# Patient Record
Sex: Female | Born: 1935
Health system: Southern US, Community
[De-identification: ages and names within clinical notes are randomized; demographics above are authoritative.]

## PROBLEM LIST (undated history)

## (undated) DIAGNOSIS — E860 Dehydration: Secondary | ICD-10-CM

## (undated) DIAGNOSIS — M810 Age-related osteoporosis without current pathological fracture: Secondary | ICD-10-CM

## (undated) DIAGNOSIS — I200 Unstable angina: Secondary | ICD-10-CM

## (undated) DIAGNOSIS — I959 Hypotension, unspecified: Secondary | ICD-10-CM

## (undated) DIAGNOSIS — R110 Nausea: Secondary | ICD-10-CM

## (undated) DIAGNOSIS — R630 Anorexia: Secondary | ICD-10-CM

## (undated) DIAGNOSIS — I2510 Atherosclerotic heart disease of native coronary artery without angina pectoris: Secondary | ICD-10-CM

## (undated) DIAGNOSIS — D3131 Benign neoplasm of right choroid: Secondary | ICD-10-CM

## (undated) DIAGNOSIS — M8580 Other specified disorders of bone density and structure, unspecified site: Secondary | ICD-10-CM

## (undated) DIAGNOSIS — J309 Allergic rhinitis, unspecified: Secondary | ICD-10-CM

## (undated) DIAGNOSIS — I10 Essential (primary) hypertension: Secondary | ICD-10-CM

## (undated) HISTORY — DX: Age-related osteoporosis without current pathological fracture: M81.0

## (undated) HISTORY — DX: Hypotension, unspecified: I95.9

## (undated) HISTORY — DX: Dehydration: E86.0

## (undated) HISTORY — DX: Allergic rhinitis, unspecified: J30.9

## (undated) HISTORY — DX: Nausea: R11.0

## (undated) HISTORY — DX: Other specified disorders of bone density and structure, unspecified site: M85.80

## (undated) HISTORY — PX: ABDOMINAL HYSTERECTOMY: SHX81

## (undated) HISTORY — DX: Anorexia: R63.0

## (undated) HISTORY — DX: Benign neoplasm of right choroid: D31.31

## (undated) HISTORY — DX: Atherosclerotic heart disease of native coronary artery without angina pectoris: I25.10

---

## 2007-01-18 ENCOUNTER — Emergency Department (HOSPITAL_COMMUNITY): Admission: EM | Admit: 2007-01-18 | Discharge: 2007-01-18 | Payer: Self-pay | Admitting: Emergency Medicine

## 2009-07-26 ENCOUNTER — Encounter: Admission: RE | Admit: 2009-07-26 | Discharge: 2009-07-26 | Payer: Self-pay | Admitting: Family Medicine

## 2009-10-10 ENCOUNTER — Ambulatory Visit (HOSPITAL_BASED_OUTPATIENT_CLINIC_OR_DEPARTMENT_OTHER): Admission: RE | Admit: 2009-10-10 | Discharge: 2009-10-10 | Payer: Self-pay | Admitting: Family Medicine

## 2009-10-10 ENCOUNTER — Ambulatory Visit: Payer: Self-pay | Admitting: Radiology

## 2010-07-25 ENCOUNTER — Other Ambulatory Visit: Payer: Self-pay | Admitting: Family Medicine

## 2010-07-25 DIAGNOSIS — Z1231 Encounter for screening mammogram for malignant neoplasm of breast: Secondary | ICD-10-CM

## 2010-08-01 ENCOUNTER — Ambulatory Visit
Admission: RE | Admit: 2010-08-01 | Discharge: 2010-08-01 | Disposition: A | Discharge status: Home / Self Care | Payer: Medicare Other | Source: Ambulatory Visit | Attending: Family Medicine | Admitting: Family Medicine

## 2010-08-01 DIAGNOSIS — Z1231 Encounter for screening mammogram for malignant neoplasm of breast: Secondary | ICD-10-CM

## 2011-02-01 LAB — DIFFERENTIAL
Basophils Relative: 0
Eosinophils Absolute: 0.1
Lymphocytes Relative: 16
Lymphs Abs: 2.1
Neutrophils Relative %: 78 — ABNORMAL HIGH

## 2011-02-01 LAB — COMPREHENSIVE METABOLIC PANEL
ALT: 15
Albumin: 3.9
BUN: 8
CO2: 26
Calcium: 9.1
Potassium: 3.9
Total Protein: 7.1

## 2011-02-01 LAB — URINALYSIS, ROUTINE W REFLEX MICROSCOPIC
Ketones, ur: 15 — AB
pH: 7.5

## 2011-02-01 LAB — URINE CULTURE: Colony Count: 6000

## 2011-02-01 LAB — CBC
HCT: 38.8
Platelets: 243
RBC: 4.34
WBC: 13.2 — ABNORMAL HIGH

## 2011-02-01 LAB — POCT CARDIAC MARKERS
CKMB, poc: <1 — ABNORMAL LOW
Myoglobin, poc: 87.8
Operator id: 270111

## 2011-02-01 LAB — APTT: aPTT: 21 — ABNORMAL LOW

## 2011-02-01 LAB — PROTIME-INR
INR: 1
Prothrombin Time: 12.9

## 2011-07-02 ENCOUNTER — Other Ambulatory Visit: Payer: Self-pay | Admitting: Family Medicine

## 2011-07-02 DIAGNOSIS — Z1231 Encounter for screening mammogram for malignant neoplasm of breast: Secondary | ICD-10-CM

## 2011-07-24 DIAGNOSIS — E559 Vitamin D deficiency, unspecified: Secondary | ICD-10-CM | POA: Diagnosis not present

## 2011-07-24 DIAGNOSIS — I10 Essential (primary) hypertension: Secondary | ICD-10-CM | POA: Diagnosis not present

## 2011-07-24 DIAGNOSIS — M810 Age-related osteoporosis without current pathological fracture: Secondary | ICD-10-CM | POA: Diagnosis not present

## 2011-08-02 ENCOUNTER — Ambulatory Visit: Payer: Medicare Other

## 2011-08-07 ENCOUNTER — Ambulatory Visit
Admission: RE | Admit: 2011-08-07 | Discharge: 2011-08-07 | Disposition: A | Discharge status: Home / Self Care | Payer: Medicare Other | Source: Ambulatory Visit | Attending: Family Medicine | Admitting: Family Medicine

## 2011-08-07 DIAGNOSIS — Z1231 Encounter for screening mammogram for malignant neoplasm of breast: Secondary | ICD-10-CM

## 2011-08-10 ENCOUNTER — Other Ambulatory Visit: Payer: Self-pay | Admitting: Family Medicine

## 2011-08-10 DIAGNOSIS — R928 Other abnormal and inconclusive findings on diagnostic imaging of breast: Secondary | ICD-10-CM

## 2011-08-13 ENCOUNTER — Ambulatory Visit
Admission: RE | Admit: 2011-08-13 | Discharge: 2011-08-13 | Disposition: A | Discharge status: Home / Self Care | Payer: Medicare Other | Source: Ambulatory Visit | Attending: Family Medicine | Admitting: Family Medicine

## 2011-08-13 DIAGNOSIS — R928 Other abnormal and inconclusive findings on diagnostic imaging of breast: Secondary | ICD-10-CM

## 2012-09-08 DIAGNOSIS — N390 Urinary tract infection, site not specified: Secondary | ICD-10-CM | POA: Diagnosis not present

## 2012-09-23 DIAGNOSIS — S0003XA Contusion of scalp, initial encounter: Secondary | ICD-10-CM | POA: Diagnosis not present

## 2012-09-23 DIAGNOSIS — R079 Chest pain, unspecified: Secondary | ICD-10-CM | POA: Diagnosis not present

## 2012-09-23 DIAGNOSIS — R55 Syncope and collapse: Secondary | ICD-10-CM | POA: Diagnosis not present

## 2012-09-30 ENCOUNTER — Other Ambulatory Visit: Payer: Self-pay | Admitting: Interventional Cardiology

## 2012-09-30 DIAGNOSIS — R55 Syncope and collapse: Secondary | ICD-10-CM | POA: Diagnosis not present

## 2012-09-30 DIAGNOSIS — I10 Essential (primary) hypertension: Secondary | ICD-10-CM | POA: Diagnosis not present

## 2012-09-30 DIAGNOSIS — I200 Unstable angina: Secondary | ICD-10-CM | POA: Diagnosis not present

## 2012-09-30 DIAGNOSIS — R079 Chest pain, unspecified: Secondary | ICD-10-CM | POA: Diagnosis not present

## 2012-10-01 ENCOUNTER — Inpatient Hospital Stay (HOSPITAL_BASED_OUTPATIENT_CLINIC_OR_DEPARTMENT_OTHER)
Admission: RE | Admit: 2012-10-01 | Discharge: 2012-10-01 | Disposition: A | Discharge status: Home / Self Care | Payer: Medicare Other | Source: Ambulatory Visit | Attending: Interventional Cardiology | Admitting: Interventional Cardiology

## 2012-10-01 ENCOUNTER — Encounter (HOSPITAL_BASED_OUTPATIENT_CLINIC_OR_DEPARTMENT_OTHER)
Admission: RE | Disposition: A | Discharge status: Home / Self Care | Payer: Self-pay | Source: Ambulatory Visit | Attending: Interventional Cardiology

## 2012-10-01 ENCOUNTER — Encounter (HOSPITAL_BASED_OUTPATIENT_CLINIC_OR_DEPARTMENT_OTHER): Payer: Self-pay | Admitting: Interventional Cardiology

## 2012-10-01 DIAGNOSIS — I2510 Atherosclerotic heart disease of native coronary artery without angina pectoris: Secondary | ICD-10-CM | POA: Insufficient documentation

## 2012-10-01 DIAGNOSIS — I10 Essential (primary) hypertension: Secondary | ICD-10-CM

## 2012-10-01 DIAGNOSIS — I200 Unstable angina: Secondary | ICD-10-CM | POA: Insufficient documentation

## 2012-10-01 HISTORY — DX: Essential (primary) hypertension: I10

## 2012-10-01 HISTORY — DX: Unstable angina: I20.0

## 2012-10-01 SURGERY — JV LEFT HEART CATHETERIZATION WITH CORONARY ANGIOGRAM
Anesthesia: Moderate Sedation

## 2012-10-01 MED ORDER — ASPIRIN 81 MG PO CHEW: 81.0000 mg | CHEWABLE_TABLET | Freq: Every day | ORAL | Status: DC

## 2012-10-01 MED ORDER — ACETAMINOPHEN 325 MG PO TABS: 650.0000 mg | ORAL_TABLET | ORAL | Status: DC | PRN

## 2012-10-01 MED ORDER — ONDANSETRON HCL 4 MG/2ML IJ SOLN: 4.0000 mg | Freq: Four times a day (QID) | INTRAMUSCULAR | Status: DC | PRN

## 2012-10-01 MED ORDER — SODIUM CHLORIDE 0.9 % IV SOLN: 1.0000 mL/kg/h | INTRAVENOUS | Status: AC

## 2012-10-01 NOTE — H&P (Signed)
  Date of Initial H&P: 09/30/12  History reviewed, patient examined, no change in status, stable for surgery.

## 2012-10-01 NOTE — OR Nursing (Signed)
Tegaderm dressing applied, site level 0, bedrest begins at 1245 

## 2012-10-01 NOTE — CV Procedure (Signed)
PROCEDURE:  Left heart catheterization with selective coronary angiography, left ventriculogram.  Abdominal aortogram.  INDICATIONS:  Unstable angina  The risks, benefits, and details of the procedure were explained to the patient.  The patient verbalized understanding and wanted to proceed.  Informed written consent was obtained.  PROCEDURE TECHNIQUE:  After Xylocaine anesthesia a 11F sheath was placed in the right femoral artery with a single anterior needle wall stick.   Left coronary angiography was done using a Judkins L4 guide catheter.  Right coronary angiography was done using a Judkins R4 guide catheter.  Left ventriculography was done using a pigtail catheter.    CONTRAST:  Total of 95 cc.  COMPLICATIONS:  None.    HEMODYNAMICS:  Aortic pressure was 164/78; LV pressure was 167/27; LVEDP 18.  There was no gradient between the left ventricle and aorta.    ANGIOGRAPHIC DATA:   The left main coronary artery is widely patent.  The left anterior descending artery is a large vessel with mild irregularities proximally.  Medium sized first diagonal with a 90% proximal stenosis.  The second diagonal is small and patent.  The left circumflex artery is a large vessel that is widely patent.  There is a medium sized OM1 that is widely patent.  There is a large OM2 that is widely patent.  The right coronary artery is a large dominant vessel that is widely patent.  The PDA is medium sized and patent.  The PLA is small.  LEFT VENTRICULOGRAM:  Left ventricular angiogram was done in the 30 RAO projection and revealed normal left ventricular wall motion and systolic function with an estimated ejection fraction of 60%.  LVEDP was 18 mmHg.  ABDOMINAL AORTOGRAM:  Mild aortic atherosclerosis in the infrarenal aorta.  No abdominal aortic aneurysm.  Dual arterial supply to the left kidney, both vessels are widely patent.  The right renal artery is widely patent.  IMPRESSIONS:  1. Normal left main  coronary artery. 2. No significant disease in the left anterior descending artery.  90% ostial to proximal stenosis in a medium sized diagonal branch. 3. No significant disease in the left circumflex artery and its branches. 4. No significant disease in the right coronary artery. 5. Normal left ventricular systolic function.  LVEDP 18 mmHg.  Ejection fraction 60%. 6.  No AAA.  No renal artery stenosis.  RECOMMENDATION:  Intensify medical therapy.  Will start beta blocker.  BP was high today despite her taking her micardis this morning.  If she has refractory angina, would add imdur as well.  If angina persisted, could attempt PTCA of the diagonal.  The vessel may be too small to stent.

## 2012-10-01 NOTE — OR Nursing (Signed)
Meal served 

## 2012-10-01 NOTE — OR Nursing (Signed)
Discharge instructions reviewed and signed, pt stated understanding, ambulated in hall without difficulty, site level 0, transported to husband's car via wheelchair 

## 2012-10-01 NOTE — OR Nursing (Signed)
Dr Isabel Caprice at bedside to discuss results and treatment plan with pt and family

## 2012-10-06 DIAGNOSIS — R55 Syncope and collapse: Secondary | ICD-10-CM | POA: Diagnosis not present

## 2012-10-13 DIAGNOSIS — R55 Syncope and collapse: Secondary | ICD-10-CM | POA: Diagnosis not present

## 2012-10-27 DIAGNOSIS — I2510 Atherosclerotic heart disease of native coronary artery without angina pectoris: Secondary | ICD-10-CM | POA: Diagnosis not present

## 2012-10-27 DIAGNOSIS — I10 Essential (primary) hypertension: Secondary | ICD-10-CM | POA: Diagnosis not present

## 2012-11-07 DIAGNOSIS — I2510 Atherosclerotic heart disease of native coronary artery without angina pectoris: Secondary | ICD-10-CM | POA: Diagnosis not present

## 2012-11-07 DIAGNOSIS — E559 Vitamin D deficiency, unspecified: Secondary | ICD-10-CM | POA: Diagnosis not present

## 2012-11-07 DIAGNOSIS — Z79899 Other long term (current) drug therapy: Secondary | ICD-10-CM | POA: Diagnosis not present

## 2012-11-07 DIAGNOSIS — IMO0001 Reserved for inherently not codable concepts without codable children: Secondary | ICD-10-CM | POA: Diagnosis not present

## 2012-11-07 DIAGNOSIS — I10 Essential (primary) hypertension: Secondary | ICD-10-CM | POA: Diagnosis not present

## 2013-02-06 DIAGNOSIS — M810 Age-related osteoporosis without current pathological fracture: Secondary | ICD-10-CM | POA: Diagnosis not present

## 2013-02-18 DIAGNOSIS — E559 Vitamin D deficiency, unspecified: Secondary | ICD-10-CM | POA: Diagnosis not present

## 2013-06-07 DIAGNOSIS — J111 Influenza due to unidentified influenza virus with other respiratory manifestations: Secondary | ICD-10-CM | POA: Diagnosis not present

## 2013-06-07 DIAGNOSIS — J40 Bronchitis, not specified as acute or chronic: Secondary | ICD-10-CM | POA: Diagnosis not present

## 2013-08-12 DIAGNOSIS — M810 Age-related osteoporosis without current pathological fracture: Secondary | ICD-10-CM | POA: Diagnosis not present

## 2013-09-11 DIAGNOSIS — L255 Unspecified contact dermatitis due to plants, except food: Secondary | ICD-10-CM | POA: Diagnosis not present

## 2013-10-13 ENCOUNTER — Encounter: Payer: Self-pay | Admitting: Cardiology

## 2013-10-13 DIAGNOSIS — I2510 Atherosclerotic heart disease of native coronary artery without angina pectoris: Secondary | ICD-10-CM | POA: Insufficient documentation

## 2013-10-19 DIAGNOSIS — H538 Other visual disturbances: Secondary | ICD-10-CM | POA: Diagnosis not present

## 2013-10-19 DIAGNOSIS — H5710 Ocular pain, unspecified eye: Secondary | ICD-10-CM | POA: Diagnosis not present

## 2013-10-26 ENCOUNTER — Ambulatory Visit: Payer: Medicare Other | Admitting: Interventional Cardiology

## 2013-12-02 DIAGNOSIS — Z961 Presence of intraocular lens: Secondary | ICD-10-CM | POA: Diagnosis not present

## 2013-12-11 DIAGNOSIS — H26499 Other secondary cataract, unspecified eye: Secondary | ICD-10-CM | POA: Diagnosis not present

## 2013-12-25 DIAGNOSIS — H26499 Other secondary cataract, unspecified eye: Secondary | ICD-10-CM | POA: Diagnosis not present

## 2014-08-09 DIAGNOSIS — M810 Age-related osteoporosis without current pathological fracture: Secondary | ICD-10-CM | POA: Diagnosis not present

## 2014-08-09 DIAGNOSIS — I10 Essential (primary) hypertension: Secondary | ICD-10-CM | POA: Diagnosis not present

## 2014-08-09 DIAGNOSIS — E559 Vitamin D deficiency, unspecified: Secondary | ICD-10-CM | POA: Diagnosis not present

## 2014-08-09 DIAGNOSIS — I2510 Atherosclerotic heart disease of native coronary artery without angina pectoris: Secondary | ICD-10-CM | POA: Diagnosis not present

## 2015-02-28 DIAGNOSIS — M810 Age-related osteoporosis without current pathological fracture: Secondary | ICD-10-CM | POA: Diagnosis not present

## 2015-03-07 DIAGNOSIS — I10 Essential (primary) hypertension: Secondary | ICD-10-CM | POA: Diagnosis not present

## 2015-03-07 DIAGNOSIS — E559 Vitamin D deficiency, unspecified: Secondary | ICD-10-CM | POA: Diagnosis not present

## 2015-06-13 DIAGNOSIS — M26602 Left temporomandibular joint disorder, unspecified: Secondary | ICD-10-CM | POA: Diagnosis not present

## 2015-06-13 DIAGNOSIS — H9202 Otalgia, left ear: Secondary | ICD-10-CM | POA: Diagnosis not present

## 2015-06-27 DIAGNOSIS — R6884 Jaw pain: Secondary | ICD-10-CM | POA: Diagnosis not present

## 2016-05-08 DIAGNOSIS — I10 Essential (primary) hypertension: Secondary | ICD-10-CM | POA: Diagnosis not present

## 2016-05-08 DIAGNOSIS — Z23 Encounter for immunization: Secondary | ICD-10-CM | POA: Diagnosis not present

## 2016-05-24 DIAGNOSIS — I10 Essential (primary) hypertension: Secondary | ICD-10-CM | POA: Diagnosis not present

## 2016-07-09 DIAGNOSIS — M62838 Other muscle spasm: Secondary | ICD-10-CM | POA: Diagnosis not present

## 2016-07-09 DIAGNOSIS — Z0000 Encounter for general adult medical examination without abnormal findings: Secondary | ICD-10-CM | POA: Diagnosis not present

## 2016-07-09 DIAGNOSIS — I10 Essential (primary) hypertension: Secondary | ICD-10-CM | POA: Diagnosis not present

## 2016-07-27 DIAGNOSIS — M79639 Pain in unspecified forearm: Secondary | ICD-10-CM | POA: Diagnosis not present

## 2016-08-02 DIAGNOSIS — J309 Allergic rhinitis, unspecified: Secondary | ICD-10-CM | POA: Diagnosis not present

## 2016-08-02 DIAGNOSIS — H6120 Impacted cerumen, unspecified ear: Secondary | ICD-10-CM | POA: Diagnosis not present

## 2016-08-22 DIAGNOSIS — R21 Rash and other nonspecific skin eruption: Secondary | ICD-10-CM | POA: Diagnosis not present

## 2016-08-24 DIAGNOSIS — L03116 Cellulitis of left lower limb: Secondary | ICD-10-CM | POA: Diagnosis not present

## 2016-10-16 DIAGNOSIS — D3131 Benign neoplasm of right choroid: Secondary | ICD-10-CM | POA: Diagnosis not present

## 2016-10-16 DIAGNOSIS — H26493 Other secondary cataract, bilateral: Secondary | ICD-10-CM | POA: Diagnosis not present

## 2016-10-16 DIAGNOSIS — H16223 Keratoconjunctivitis sicca, not specified as Sjogren's, bilateral: Secondary | ICD-10-CM | POA: Diagnosis not present

## 2017-01-10 DIAGNOSIS — E559 Vitamin D deficiency, unspecified: Secondary | ICD-10-CM | POA: Diagnosis not present

## 2017-01-10 DIAGNOSIS — I10 Essential (primary) hypertension: Secondary | ICD-10-CM | POA: Diagnosis not present

## 2017-04-09 DIAGNOSIS — R1011 Right upper quadrant pain: Secondary | ICD-10-CM | POA: Diagnosis not present

## 2017-04-09 DIAGNOSIS — R0781 Pleurodynia: Secondary | ICD-10-CM | POA: Diagnosis not present

## 2017-05-01 DIAGNOSIS — I998 Other disorder of circulatory system: Secondary | ICD-10-CM | POA: Diagnosis not present

## 2017-05-01 DIAGNOSIS — I10 Essential (primary) hypertension: Secondary | ICD-10-CM | POA: Diagnosis not present

## 2017-05-31 DIAGNOSIS — R300 Dysuria: Secondary | ICD-10-CM | POA: Diagnosis not present

## 2017-06-07 ENCOUNTER — Other Ambulatory Visit: Payer: Self-pay | Admitting: Family Medicine

## 2017-06-07 ENCOUNTER — Ambulatory Visit
Admission: RE | Admit: 2017-06-07 | Discharge: 2017-06-07 | Disposition: A | Discharge status: Home / Self Care | Payer: Medicare Other | Source: Ambulatory Visit | Attending: Family Medicine | Admitting: Family Medicine

## 2017-06-07 DIAGNOSIS — R109 Unspecified abdominal pain: Secondary | ICD-10-CM

## 2017-06-07 DIAGNOSIS — K5730 Diverticulosis of large intestine without perforation or abscess without bleeding: Secondary | ICD-10-CM | POA: Diagnosis not present

## 2017-06-07 MED ORDER — IOPAMIDOL (ISOVUE-300) INJECTION 61%
100.0000 mL | Freq: Once | INTRAVENOUS | Status: AC | PRN
  Administered 2017-06-07: 100 mL via INTRAVENOUS

## 2017-06-12 DIAGNOSIS — R3915 Urgency of urination: Secondary | ICD-10-CM | POA: Diagnosis not present

## 2017-06-12 DIAGNOSIS — N390 Urinary tract infection, site not specified: Secondary | ICD-10-CM | POA: Diagnosis not present

## 2017-06-12 DIAGNOSIS — R319 Hematuria, unspecified: Secondary | ICD-10-CM | POA: Diagnosis not present

## 2017-06-12 DIAGNOSIS — B029 Zoster without complications: Secondary | ICD-10-CM | POA: Diagnosis not present

## 2017-06-21 DIAGNOSIS — R319 Hematuria, unspecified: Secondary | ICD-10-CM | POA: Diagnosis not present

## 2017-06-21 DIAGNOSIS — N390 Urinary tract infection, site not specified: Secondary | ICD-10-CM | POA: Diagnosis not present

## 2017-07-16 DIAGNOSIS — B3789 Other sites of candidiasis: Secondary | ICD-10-CM | POA: Diagnosis not present

## 2017-07-16 DIAGNOSIS — Z1231 Encounter for screening mammogram for malignant neoplasm of breast: Secondary | ICD-10-CM | POA: Diagnosis not present

## 2017-07-16 DIAGNOSIS — I10 Essential (primary) hypertension: Secondary | ICD-10-CM | POA: Diagnosis not present

## 2017-07-16 DIAGNOSIS — M810 Age-related osteoporosis without current pathological fracture: Secondary | ICD-10-CM | POA: Diagnosis not present

## 2017-07-16 DIAGNOSIS — K5909 Other constipation: Secondary | ICD-10-CM | POA: Diagnosis not present

## 2017-07-16 DIAGNOSIS — E559 Vitamin D deficiency, unspecified: Secondary | ICD-10-CM | POA: Diagnosis not present

## 2017-07-16 DIAGNOSIS — I2510 Atherosclerotic heart disease of native coronary artery without angina pectoris: Secondary | ICD-10-CM | POA: Diagnosis not present

## 2017-07-16 DIAGNOSIS — Z0000 Encounter for general adult medical examination without abnormal findings: Secondary | ICD-10-CM | POA: Diagnosis not present

## 2017-07-22 ENCOUNTER — Other Ambulatory Visit: Payer: Self-pay | Admitting: Family Medicine

## 2017-07-22 DIAGNOSIS — Z1231 Encounter for screening mammogram for malignant neoplasm of breast: Secondary | ICD-10-CM

## 2017-08-15 ENCOUNTER — Ambulatory Visit: Payer: Medicare Other

## 2017-08-28 DIAGNOSIS — S60512A Abrasion of left hand, initial encounter: Secondary | ICD-10-CM | POA: Diagnosis not present

## 2017-08-28 DIAGNOSIS — W5503XA Scratched by cat, initial encounter: Secondary | ICD-10-CM | POA: Diagnosis not present

## 2017-08-28 DIAGNOSIS — Z23 Encounter for immunization: Secondary | ICD-10-CM | POA: Diagnosis not present

## 2017-09-18 ENCOUNTER — Ambulatory Visit: Payer: Medicare Other

## 2017-10-31 ENCOUNTER — Ambulatory Visit
Admission: RE | Admit: 2017-10-31 | Discharge: 2017-10-31 | Disposition: A | Discharge status: Home / Self Care | Payer: Medicare Other | Source: Ambulatory Visit | Attending: Family Medicine | Admitting: Family Medicine

## 2017-10-31 DIAGNOSIS — Z1231 Encounter for screening mammogram for malignant neoplasm of breast: Secondary | ICD-10-CM

## 2017-11-19 DIAGNOSIS — H938X2 Other specified disorders of left ear: Secondary | ICD-10-CM | POA: Diagnosis not present

## 2017-11-19 DIAGNOSIS — H9202 Otalgia, left ear: Secondary | ICD-10-CM | POA: Diagnosis not present

## 2017-11-19 DIAGNOSIS — H6122 Impacted cerumen, left ear: Secondary | ICD-10-CM | POA: Diagnosis not present

## 2017-12-10 DIAGNOSIS — S46911A Strain of unspecified muscle, fascia and tendon at shoulder and upper arm level, right arm, initial encounter: Secondary | ICD-10-CM | POA: Diagnosis not present

## 2018-01-28 DIAGNOSIS — W19XXXA Unspecified fall, initial encounter: Secondary | ICD-10-CM | POA: Diagnosis not present

## 2018-01-28 DIAGNOSIS — R109 Unspecified abdominal pain: Secondary | ICD-10-CM | POA: Diagnosis not present

## 2018-01-28 DIAGNOSIS — T148XXA Other injury of unspecified body region, initial encounter: Secondary | ICD-10-CM | POA: Diagnosis not present

## 2018-02-04 DIAGNOSIS — E559 Vitamin D deficiency, unspecified: Secondary | ICD-10-CM | POA: Diagnosis not present

## 2018-02-04 DIAGNOSIS — L989 Disorder of the skin and subcutaneous tissue, unspecified: Secondary | ICD-10-CM | POA: Diagnosis not present

## 2018-02-04 DIAGNOSIS — I10 Essential (primary) hypertension: Secondary | ICD-10-CM | POA: Diagnosis not present

## 2018-02-04 DIAGNOSIS — M810 Age-related osteoporosis without current pathological fracture: Secondary | ICD-10-CM | POA: Diagnosis not present

## 2018-02-04 DIAGNOSIS — J302 Other seasonal allergic rhinitis: Secondary | ICD-10-CM | POA: Diagnosis not present

## 2018-02-04 DIAGNOSIS — S20212D Contusion of left front wall of thorax, subsequent encounter: Secondary | ICD-10-CM | POA: Diagnosis not present

## 2018-02-04 DIAGNOSIS — I2510 Atherosclerotic heart disease of native coronary artery without angina pectoris: Secondary | ICD-10-CM | POA: Diagnosis not present

## 2018-02-07 DIAGNOSIS — Z23 Encounter for immunization: Secondary | ICD-10-CM | POA: Diagnosis not present

## 2018-02-07 DIAGNOSIS — L821 Other seborrheic keratosis: Secondary | ICD-10-CM | POA: Diagnosis not present

## 2018-02-07 DIAGNOSIS — L989 Disorder of the skin and subcutaneous tissue, unspecified: Secondary | ICD-10-CM | POA: Diagnosis not present

## 2018-04-06 IMAGING — CT CT ABD-PELV W/ CM
1 of 3 series · 14 of 32 positions shown, 19 images · IV contrast (APPLIED)
Comparison: None.

CLINICAL DATA: Right lower quadrant pain

EXAM:
CT ABDOMEN AND PELVIS WITH CONTRAST
TECHNIQUE: Multidetector CT imaging of the abdomen and pelvis was performed
using the standard protocol following bolus administration of
intravenous contrast.
CONTRAST:  100mL 68I7AK-WAA IOPAMIDOL (68I7AK-WAA) INJECTION 61%

[Series 2: abd/pelvis w/cm · axial · 0.65mm/px · z∈[-400,-45]mm · 14 of 81 slices shown, 19 images]
[im 5/81  soft-tissue]
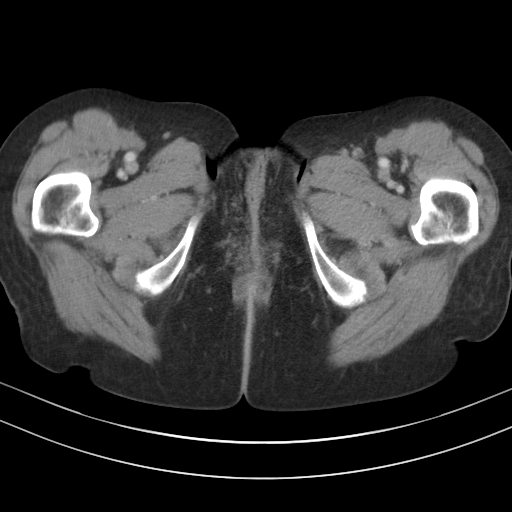
[im 5/81  bone]
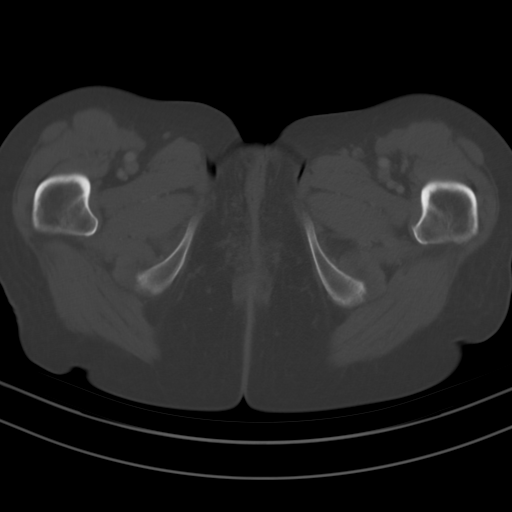
[im 13/81  soft-tissue]
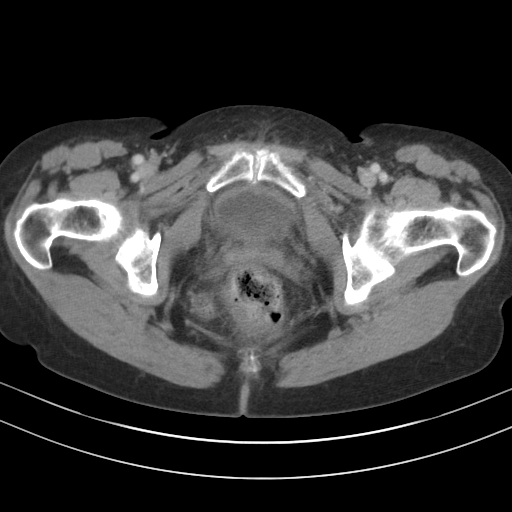
[im 17/81  soft-tissue]
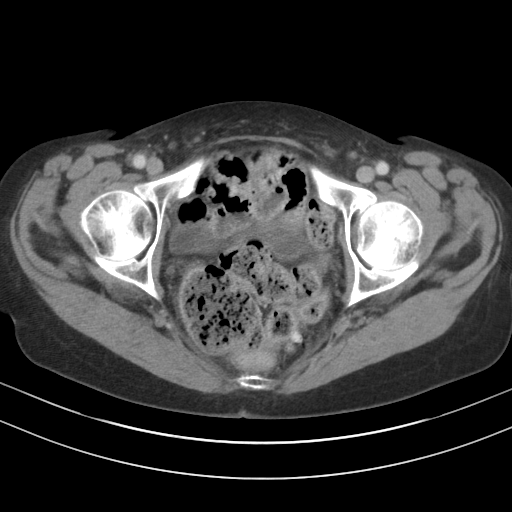
[im 22/81  soft-tissue]
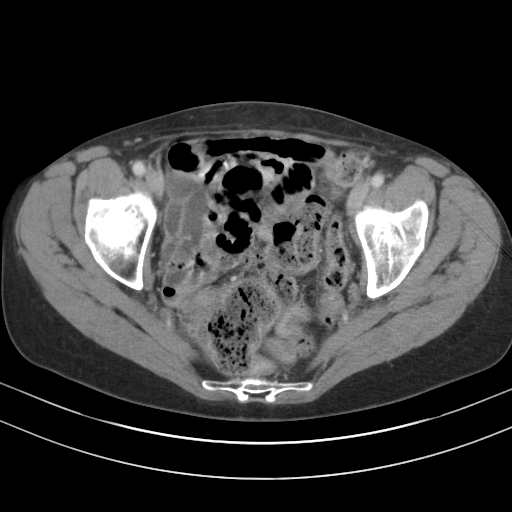
[im 30/81  soft-tissue]
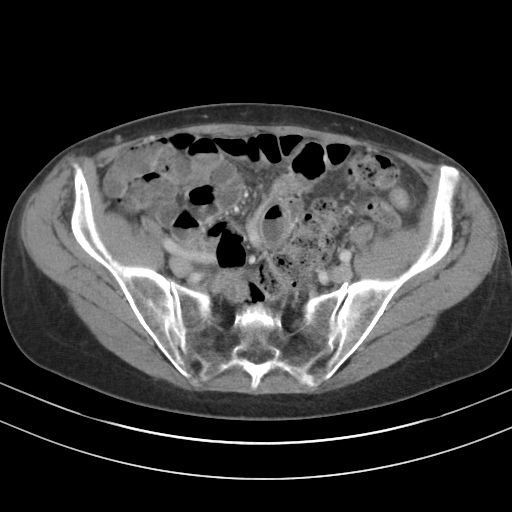
[im 34/81  soft-tissue]
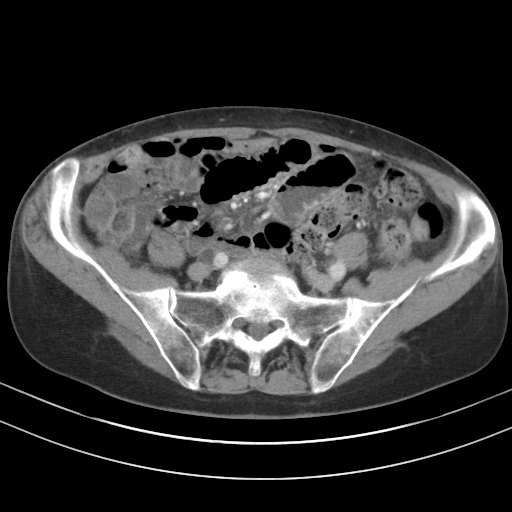
[im 43/81  soft-tissue]
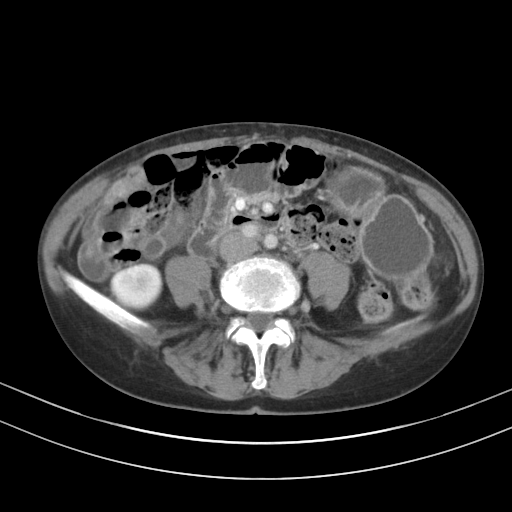
[im 47/81  soft-tissue]
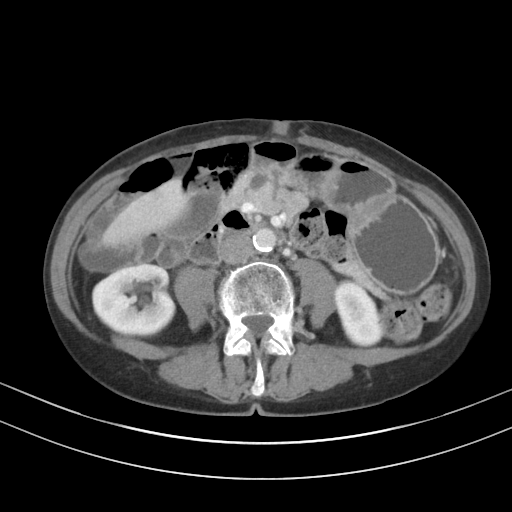
[im 51/81  soft-tissue]
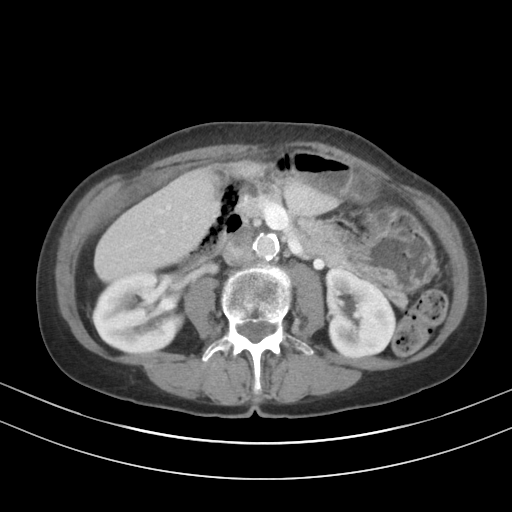
[im 51/81  bone]
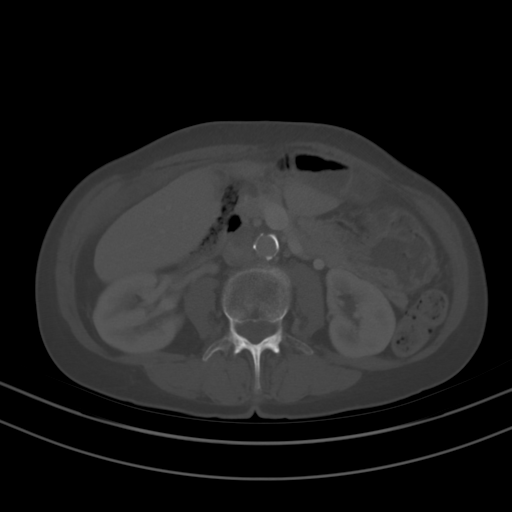
[im 59/81  soft-tissue]
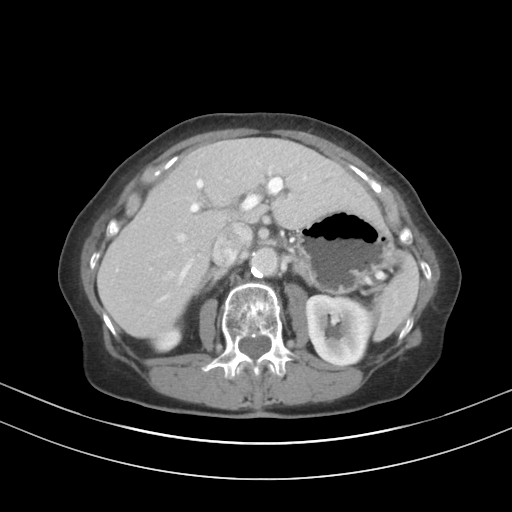
[im 64/81  soft-tissue]
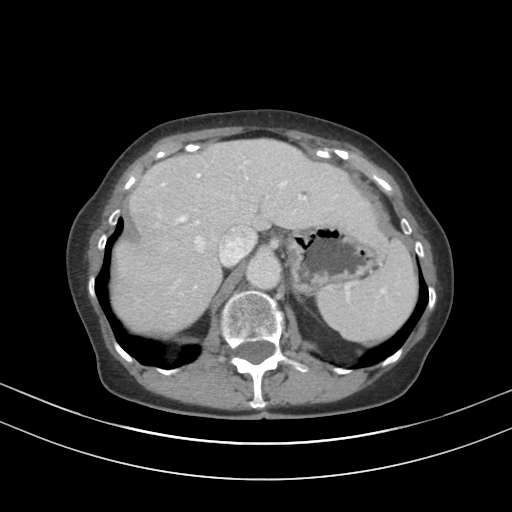
[im 64/81  lung]
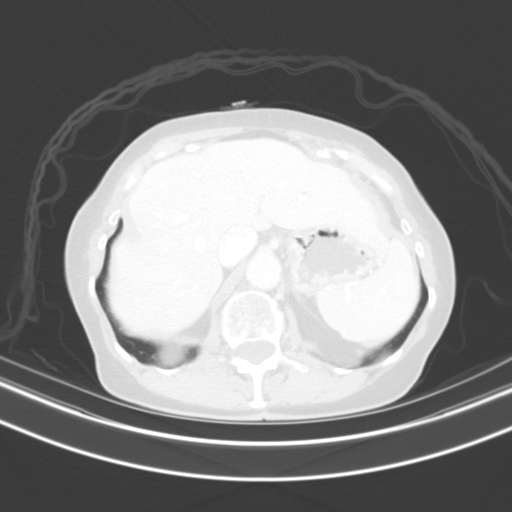
[im 68/81  soft-tissue]
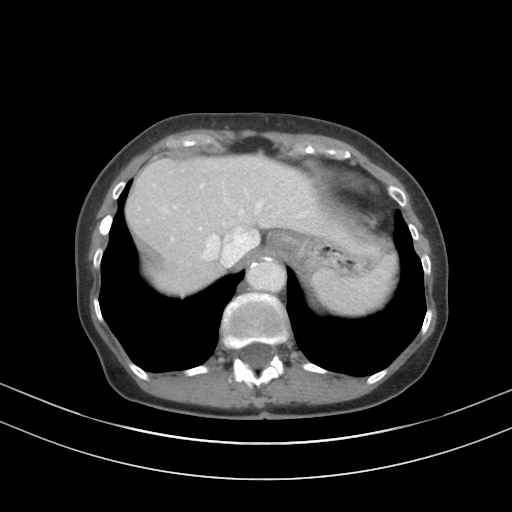
[im 68/81  lung]
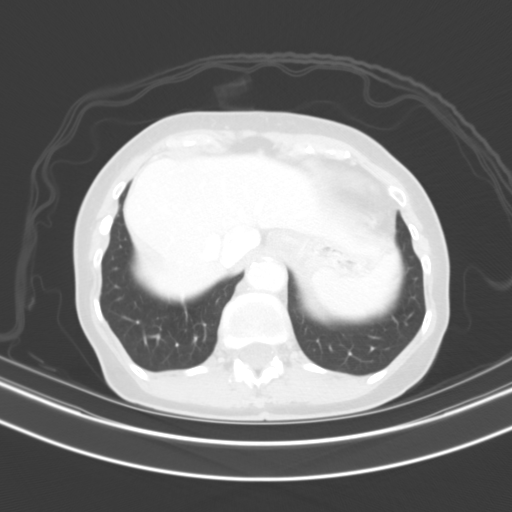
[im 72/81  lung]
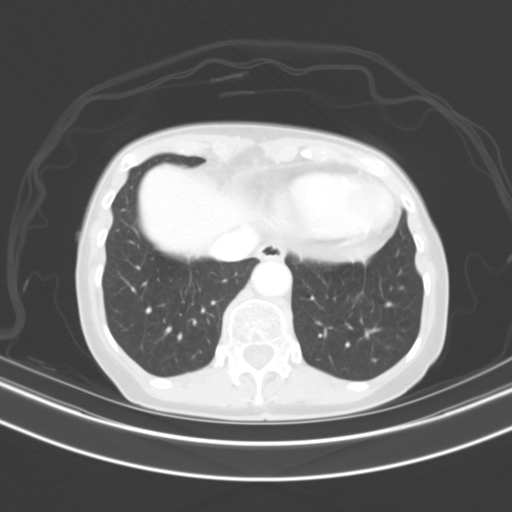
[im 76/81  soft-tissue]
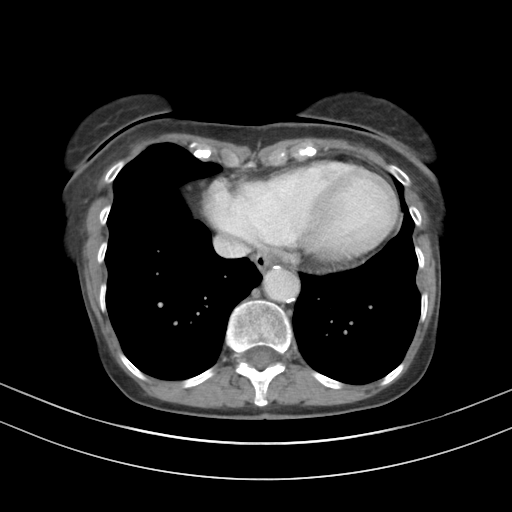
[im 76/81  lung]
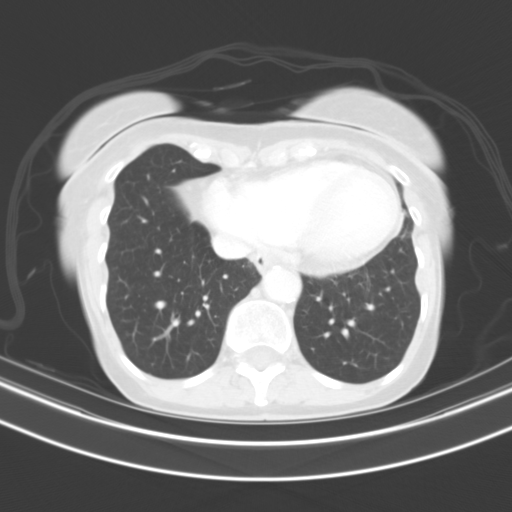

[14 of 32 positions shown; findings below may reference images not displayed]

FINDINGS: Lower chest: Cardiomegaly. Pectus deformity of the sternum. Lung
bases clear. No effusions.

Hepatobiliary: No focal hepatic abnormality. Gallbladder
unremarkable.

Pancreas: No focal abnormality or ductal dilatation.

Spleen: No focal abnormality.  Normal size.

Adrenals/Urinary Tract: No adrenal abnormality. No focal renal
abnormality. No stones or hydronephrosis. Urinary bladder is
unremarkable.

Stomach/Bowel: Moderate stool burden in the colon. Stomach, large
and small bowel grossly unremarkable. Scattered sigmoid diverticula.
No active diverticulitis.

Vascular/Lymphatic: Aortic atherosclerosis. No enlarged abdominal or
pelvic lymph nodes.

Reproductive: Prior hysterectomy.  No adnexal masses.

Other: No free fluid or free air.

Musculoskeletal: No acute bony abnormality.
IMPRESSION: Cardiomegaly, aortic atherosclerosis.

Moderate stool burden in the colon. Sigmoid diverticulosis. No
active diverticulitis.

Appendix not definitively seen. No inflammatory process in the right
lower quadrant.

## 2018-04-08 DIAGNOSIS — R42 Dizziness and giddiness: Secondary | ICD-10-CM | POA: Diagnosis not present

## 2018-04-23 DIAGNOSIS — Z9289 Personal history of other medical treatment: Secondary | ICD-10-CM

## 2018-04-23 HISTORY — DX: Personal history of other medical treatment: Z92.89

## 2018-07-04 DIAGNOSIS — R2689 Other abnormalities of gait and mobility: Secondary | ICD-10-CM | POA: Diagnosis not present

## 2018-07-04 DIAGNOSIS — I499 Cardiac arrhythmia, unspecified: Secondary | ICD-10-CM | POA: Diagnosis not present

## 2018-09-23 ENCOUNTER — Other Ambulatory Visit: Payer: Self-pay | Admitting: Family Medicine

## 2018-09-23 DIAGNOSIS — Z1231 Encounter for screening mammogram for malignant neoplasm of breast: Secondary | ICD-10-CM

## 2018-09-30 DIAGNOSIS — J018 Other acute sinusitis: Secondary | ICD-10-CM | POA: Diagnosis not present

## 2018-11-14 ENCOUNTER — Ambulatory Visit
Admission: RE | Admit: 2018-11-14 | Discharge: 2018-11-14 | Disposition: A | Discharge status: Home / Self Care | Payer: Medicare Other | Source: Ambulatory Visit | Attending: Family Medicine | Admitting: Family Medicine

## 2018-11-14 ENCOUNTER — Other Ambulatory Visit: Payer: Self-pay

## 2018-11-14 DIAGNOSIS — Z1231 Encounter for screening mammogram for malignant neoplasm of breast: Secondary | ICD-10-CM

## 2019-02-12 DIAGNOSIS — Z23 Encounter for immunization: Secondary | ICD-10-CM | POA: Diagnosis not present

## 2019-02-18 DIAGNOSIS — R109 Unspecified abdominal pain: Secondary | ICD-10-CM | POA: Diagnosis not present

## 2019-02-23 ENCOUNTER — Emergency Department (HOSPITAL_BASED_OUTPATIENT_CLINIC_OR_DEPARTMENT_OTHER): Payer: Medicare Other

## 2019-02-23 ENCOUNTER — Emergency Department (HOSPITAL_BASED_OUTPATIENT_CLINIC_OR_DEPARTMENT_OTHER)
Admission: EM | Admit: 2019-02-23 | Discharge: 2019-02-23 | Disposition: A | Discharge status: Home / Self Care | Payer: Medicare Other | Attending: Emergency Medicine | Admitting: Emergency Medicine

## 2019-02-23 ENCOUNTER — Other Ambulatory Visit: Payer: Self-pay

## 2019-02-23 ENCOUNTER — Encounter (HOSPITAL_BASED_OUTPATIENT_CLINIC_OR_DEPARTMENT_OTHER): Payer: Self-pay | Admitting: *Deleted

## 2019-02-23 DIAGNOSIS — I10 Essential (primary) hypertension: Secondary | ICD-10-CM | POA: Insufficient documentation

## 2019-02-23 DIAGNOSIS — R55 Syncope and collapse: Secondary | ICD-10-CM | POA: Diagnosis not present

## 2019-02-23 DIAGNOSIS — S299XXA Unspecified injury of thorax, initial encounter: Secondary | ICD-10-CM | POA: Diagnosis not present

## 2019-02-23 DIAGNOSIS — I2510 Atherosclerotic heart disease of native coronary artery without angina pectoris: Secondary | ICD-10-CM | POA: Diagnosis not present

## 2019-02-23 DIAGNOSIS — R079 Chest pain, unspecified: Secondary | ICD-10-CM | POA: Diagnosis not present

## 2019-02-23 DIAGNOSIS — M545 Low back pain: Secondary | ICD-10-CM | POA: Diagnosis not present

## 2019-02-23 LAB — COMPREHENSIVE METABOLIC PANEL
ALT: 14 U/L (ref 0–44)
AST: 25 U/L (ref 15–41)
Albumin: 4.1 g/dL (ref 3.5–5.0)
Alkaline Phosphatase: 64 U/L (ref 38–126)
Anion gap: 11 (ref 5–15)
BUN: 14 mg/dL (ref 8–23)
CO2: 25 mmol/L (ref 22–32)
Calcium: 9.3 mg/dL (ref 8.9–10.3)
Chloride: 102 mmol/L (ref 98–111)
Creatinine, Ser: 0.64 mg/dL (ref 0.44–1.00)
GFR calc Af Amer: >60 mL/min (ref >60)
GFR calc non Af Amer: >60 mL/min (ref >60)
Glucose, Bld: 104 mg/dL — ABNORMAL HIGH (ref 70–99)
Potassium: 4.2 mmol/L (ref 3.5–5.1)
Sodium: 138 mmol/L (ref 135–145)
Total Bilirubin: 0.6 mg/dL (ref 0.3–1.2)
Total Protein: 7.1 g/dL (ref 6.5–8.1)

## 2019-02-23 LAB — CBC WITH DIFFERENTIAL/PLATELET
Abs Immature Granulocytes: 0.02 10*3/uL (ref 0.00–0.07)
Basophils Absolute: 0 10*3/uL (ref 0.0–0.1)
Basophils Relative: 0 %
Eosinophils Absolute: 0 10*3/uL (ref 0.0–0.5)
Eosinophils Relative: 0 %
HCT: 41.2 % (ref 36.0–46.0)
Hemoglobin: 13.3 g/dL (ref 12.0–15.0)
Immature Granulocytes: 0 %
Lymphocytes Relative: 18 %
Lymphs Abs: 1.5 10*3/uL (ref 0.7–4.0)
MCH: 29.7 pg (ref 26.0–34.0)
MCHC: 32.3 g/dL (ref 30.0–36.0)
MCV: 92 fL (ref 80.0–100.0)
Monocytes Absolute: 0.4 10*3/uL (ref 0.1–1.0)
Monocytes Relative: 5 %
Neutro Abs: 6.2 10*3/uL (ref 1.7–7.7)
Neutrophils Relative %: 77 %
Platelets: 268 10*3/uL (ref 150–400)
RBC: 4.48 MIL/uL (ref 3.87–5.11)
RDW: 14.2 % (ref 11.5–15.5)
WBC: 8.1 10*3/uL (ref 4.0–10.5)
nRBC: 0 % (ref 0.0–0.2)

## 2019-02-23 LAB — TROPONIN I (HIGH SENSITIVITY): Troponin I (High Sensitivity): 15 ng/L (ref <18)

## 2019-02-23 NOTE — Discharge Instructions (Signed)
You were seen in the emergency department today after passing out at home.  We discussed possibly admitting you to the hospital for further testing but will move ahead with referral to the cardiologist.  Please call today to schedule an appointment.  I have placed a referral as well to assist with getting this appointment.  If you feel any lightheadedness, chest pain, racing heartbeats, shortness of breath, or any worsening symptoms I do encourage you to return to the emergency department immediately or call 911 if they are severe.

## 2019-02-23 NOTE — ED Provider Notes (Signed)
Emergency Department Provider Note   I have reviewed the triage vital signs and the nursing notes.   HISTORY  Chief Complaint Loss of Consciousness   HPI Kristina Bruce is a 85 y.o. female with past medical history of hypertension presents to the emergency department for evaluation after possible syncope event.  Patient lives alone and states that she was getting up to feed her cats.  She was walking into the kitchen but does not remember anything until she woke up on the floor.  She is having some mild lower back pain but denies headache.  She did not experience chest pain, palpitations, shortness of breath prior to the fall/syncope episode.  No prior history of syncope or seizure.  Denies any pain in the mouth/tongue.  No urine incontinence.  Patient states that she was able to get back to her chair and called her neighbor who initially transported her to an [facility] urgent care.  Patient was redirected from that location to the emergency department.  No new medications.  He states that right now she is feeling well.  No vision changes, speech changes, weakness/numbness.   Past Medical History:  Diagnosis Date  . Allergic rhinitis   . Coronary atherosclerosis of native coronary artery   . Essential hypertension, benign   . Essential hypertension, benign   . Intermediate coronary syndrome ([place])   . Osteopenia    with Vitamin D Deficiency     Patient Active Problem List   Diagnosis Date Noted  . Coronary atherosclerosis of native coronary artery   . Intermediate coronary syndrome ([place])   . Essential hypertension, benign     History reviewed. No pertinent surgical history.  Allergies Patient has no known allergies.  Family History  Problem Relation Age of Onset  . Breast cancer Mother   . Heart attack Son        stents    Social History Social History   Tobacco Use  . Smoking status: Never Smoker  . Smokeless tobacco: Never Used  Substance Use Topics  .  Alcohol use: No  . Drug use: No    Review of Systems  Constitutional: No fever/chills Eyes: No visual changes. ENT: No sore throat. Cardiovascular: Denies chest pain. Positive LOC.  Respiratory: Denies shortness of breath. Gastrointestinal: No abdominal pain.  No nausea, no vomiting.  No diarrhea.  No constipation. Genitourinary: Negative for dysuria. Musculoskeletal: Negative for back pain. Skin: Negative for rash. Neurological: Negative for headaches, focal weakness or numbness.  10-point ROS otherwise negative.  ____________________________________________   PHYSICAL EXAM:  VITAL SIGNS: ED Triage Vitals  Enc Vitals Group     BP 02/23/19 0930 (!) 185/115     Pulse Rate 02/23/19 0930 79     Resp 02/23/19 0930 20     Temp 02/23/19 0930 97.9 F (36.6 C)     Temp Source 02/23/19 0930 Oral     SpO2 02/23/19 0930 100 %     Weight 02/23/19 0932 108 lb (49 kg)     Height 02/23/19 0932 5\' 4"  (1.626 m)   Constitutional: Alert and oriented. Well appearing and in no acute distress. Eyes: Conjunctivae are normal.  Head: Atraumatic. Nose: No congestion/rhinnorhea. Mouth/Throat: Mucous membranes are moist.  Neck: No stridor.  Cardiovascular: Normal rate, regular rhythm. Good peripheral circulation. Grossly normal heart sounds.   Respiratory: Normal respiratory effort.  No retractions. Lungs CTAB. Gastrointestinal: Soft and nontender. No distention.  Musculoskeletal: No lower extremity tenderness nor edema. No gross deformities  of extremities. Neurologic:  Normal speech and language. No gross focal neurologic deficits are appreciated.  Skin:  Skin is warm, dry and intact. No rash noted.  ____________________________________________   LABS (all labs ordered are listed, but only abnormal results are displayed)  Labs Reviewed  COMPREHENSIVE METABOLIC PANEL - Abnormal; Notable for the following components:      Result Value   Glucose, Bld 104 (*)    All other components  within normal limits  CBC WITH DIFFERENTIAL/PLATELET  TROPONIN I (HIGH SENSITIVITY)  TROPONIN I (HIGH SENSITIVITY)   ____________________________________________  EKG   EKG Interpretation  Date/Time:  Monday February 23 2019 10:46:01 EST Ventricular Rate:  74 PR Interval:    QRS Duration: 104 QT Interval:  385 QTC Calculation: 428 R Axis:   54 Text Interpretation: Sinus rhythm Atrial premature complexes Biatrial enlargement RSR' in V1 or V2, right VCD or RVH No STEMI Similar to 2008 tracing. Confirmed by Nanda Quinton [phone]) on 02/23/2019 11:04:36 AM       ____________________________________________  RADIOLOGY  Dg Chest 2 View  Result Date: 02/23/2019 CLINICAL DATA:  Fall, pain EXAM: CHEST - 2 VIEW COMPARISON:  04/09/2017, 01/18/2007 FINDINGS: Heart size is upper limits of normal and unchanged from prior. Calcific aortic knob. No focal airspace consolidation, pleural effusion, or pneumothorax. Mild superior endplate depressions approximately the T12 and L1 levels. IMPRESSION: 1. No acute cardiopulmonary findings. 2. Mild superior endplate depressions at approximately T12 and L1, age indeterminate. Electronically Signed   By: Davina Poke M.D.   On: 02/23/2019 10:23   Dg Lumbar Spine Complete  Result Date: 02/23/2019 CLINICAL DATA:  Back pain after fall EXAM: LUMBAR SPINE - COMPLETE 4+ VIEW COMPARISON:  None. FINDINGS: There is mild superior endplate depressions at T12, L1, and L2. The remaining vertebral body heights are maintained. Minimal intervertebral disc height loss within the lower lumbar spine. Mild lower lumbar facet arthrosis. Abdominal aortic atherosclerotic calcification. IMPRESSION: Mild superior endplate depressions of T12, L1, and L2, age indeterminate. Correlation for point tenderness these levels is recommended. Electronically Signed   By: Davina Poke M.D.   On: 02/23/2019 10:25   Ct Head Wo Contrast  Result Date: 02/23/2019 CLINICAL DATA:  Syncope EXAM:  CT HEAD WITHOUT CONTRAST TECHNIQUE: Contiguous axial images were obtained from the base of the skull through the vertex without intravenous contrast. COMPARISON:  October 10, 2009 FINDINGS: Brain: There is mild to moderate diffuse atrophy, slightly greater than on the 2011 study. There is no intracranial mass, hemorrhage, extra-axial fluid collection, or midline shift. There is mild small vessel disease in the centra semiovale bilaterally. Elsewhere brain parenchyma appears unremarkable. No acute infarct evident. Vascular: No hyperdense vessel evident. There is calcification in each carotid siphon region. Skull: The bony calvarium appears intact. Sinuses/Orbits: There is mucosal thickening in several ethmoid air cells. Other visualized paranasal sinuses are clear. Visualized orbits appear symmetric bilaterally. Other: Mastoid air cells are clear. IMPRESSION: Atrophy with mild periventricular small vessel disease. No acute infarct evident. No mass or hemorrhage. There are foci of arterial vascular calcification. There is mucosal thickening in several ethmoid air cells. Electronically Signed   By: Lowella Grip III M.D.   On: 02/23/2019 10:11    ____________________________________________   PROCEDURES  Procedure(s) performed:   Procedures  None ____________________________________________   INITIAL IMPRESSION / ASSESSMENT AND PLAN / ED COURSE  Pertinent labs & imaging results that were available during my care of the patient were reviewed by me and considered in my medical  decision making (see chart for details).   Patient presents to the emergency department for evaluation after likely syncope event.  The event was not witnessed.  Patient does not have stigmata of seizure.  No clear description of a postictal phase.  Patient's age puts her at elevated risk along with her hypertension but she is overall very healthy and on very few medications.   Labs, imaging including plain films and CT  interpreted. No acute findings. Patient does not have point tenderness to correlate with indeterminate age compression fractures. Favor old fractures.   No ectopy or other events on tele here in the ED. Discussed patient's presentation and options for further w/u. Given age, encouraged admit for monitoring and ECHO. Patient is fairly adamant about not wanting admit and wanting to be discharged. Agreed to this plan but with Cardiology referral and agreement to return immediately with any new or worsening symptoms. We discussed her living situation and risk for sudden cardiac death due to abnormal heart rhythm. Patient understands and is able to fully engage in conversation regarding this. Has capacity to make decision for discharge at this time.  ____________________________________________  FINAL CLINICAL IMPRESSION(S) / ED DIAGNOSES  Final diagnoses:  Syncope and collapse    Note:  This document was prepared using Dragon voice recognition software and may include unintentional dictation errors.  Nanda Quinton, MD, [facility]    Long, [place], MD 02/23/19 [phone]

## 2019-02-23 NOTE — ED Notes (Signed)
Pt getting dressed and ride is outside waiting

## 2019-02-23 NOTE — ED Notes (Signed)
ED Provider at bedside. 

## 2019-02-23 NOTE — ED Triage Notes (Signed)
Passed out this morning possibly 5 minutes at her home while trying to feed her cats.

## 2019-03-03 ENCOUNTER — Ambulatory Visit (INDEPENDENT_AMBULATORY_CARE_PROVIDER_SITE_OTHER): Payer: Medicare Other

## 2019-03-03 ENCOUNTER — Encounter: Payer: Self-pay | Admitting: Cardiology

## 2019-03-03 ENCOUNTER — Ambulatory Visit (INDEPENDENT_AMBULATORY_CARE_PROVIDER_SITE_OTHER): Payer: Medicare Other | Admitting: Cardiology

## 2019-03-03 ENCOUNTER — Other Ambulatory Visit: Payer: Self-pay

## 2019-03-03 VITALS — BP 192/90 | HR 60 | Ht 64.0 in | Wt 109.0 lb

## 2019-03-03 DIAGNOSIS — R55 Syncope and collapse: Secondary | ICD-10-CM

## 2019-03-03 DIAGNOSIS — I491 Atrial premature depolarization: Secondary | ICD-10-CM | POA: Diagnosis not present

## 2019-03-03 DIAGNOSIS — E782 Mixed hyperlipidemia: Secondary | ICD-10-CM

## 2019-03-03 DIAGNOSIS — I10 Essential (primary) hypertension: Secondary | ICD-10-CM

## 2019-03-03 HISTORY — DX: Mixed hyperlipidemia: E78.2

## 2019-03-03 HISTORY — DX: Atrial premature depolarization: I49.1

## 2019-03-03 HISTORY — DX: Essential (primary) hypertension: I10

## 2019-03-03 HISTORY — DX: Syncope and collapse: R55

## 2019-03-03 NOTE — Progress Notes (Signed)
Cardiology Office Note:    Date:  03/03/2019   ID:  Kristina Bruce, DOB 1930/12/21, MRN [id number]  PCP:  Glenford Bayley, DO  Cardiologist:  No primary care provider on file.  Electrophysiologist:  None   Referring MD: Margette Fast, MD   The patient was referred by her PCP due to a syncope episode.  History of Present Illness:    Kristina Bruce is a 85 y.o. female with a hx of hypertension, hyperlipidemia presents to be reports syncope episode.  Patient said on February 23, 2019 she was awakened by her cat which is not unusual and she went to the pantry to go and feed the cats.  All she can remember waking up in the kitchen with food all over her.  She does not remember experiencing any symptoms prior to this.  She denies any lightheadedness or dizziness.  She has not had any other symptoms since.  On February 23, 2019 prior to coming to the ED admits to heartburn the patient states that she was taken to [facility] urgent care and she was then redirected to the emergency department after reporting that she had a syncope episode.  Her work-up did not reveal any potential cause of her syncope.  There then last to see cardiology in follow-up.    Past Medical History:  Diagnosis Date  . Allergic rhinitis   . Coronary atherosclerosis of native coronary artery   . Essential hypertension, benign   . Essential hypertension, benign   . Intermediate coronary syndrome ([place])   . Osteopenia    with Vitamin D Deficiency     History reviewed. No pertinent surgical history.  Current Medications: Current Meds  Medication Sig  . Calcium Carbonate-Vitamin D (CALCIUM + D PO) Take by mouth daily.  . Cholecalciferol (VITAMIN D) 2000 UNITS tablet Take 2,000 Units by mouth daily.  . fexofenadine (ALLEGRA) 60 MG tablet Take 60 mg by mouth 2 (two) times daily.  . fluticasone (FLONASE) 50 MCG/ACT nasal spray Place 1 spray into both nostrils daily.  Marland Kitchen telmisartan (MICARDIS) 80 MG tablet Take 80 mg by  mouth daily.     Allergies:   Patient has no known allergies.   Social History   Socioeconomic History  . Marital status: Married    Spouse name: Not on file  . Number of children: Not on file  . Years of education: Not on file  . Highest education level: Not on file  Occupational History  . Not on file  Social Needs  . Financial resource strain: Not on file  . Food insecurity    Worry: Not on file    Inability: Not on file  . Transportation needs    Medical: Not on file    Non-medical: Not on file  Tobacco Use  . Smoking status: Never Smoker  . Smokeless tobacco: Never Used  Substance and Sexual Activity  . Alcohol use: No  . Drug use: No  . Sexual activity: Not on file  Lifestyle  . Physical activity    Days per week: Not on file    Minutes per session: Not on file  . Stress: Not on file  Relationships  . Social Herbalist on phone: Not on file    Gets together: Not on file    Attends religious service: Not on file    Active member of club or organization: Not on file    Attends meetings of clubs or organizations: Not  on file    Relationship status: Not on file  Other Topics Concern  . Not on file  Social History Narrative  . Not on file     Family History: The patient's family history includes Breast cancer in her mother; Heart attack in her son.  ROS:   Review of Systems  Constitution: Negative for decreased appetite, fever and weight gain.  HENT: Negative for congestion, ear discharge, hoarse voice and sore throat.   Eyes: Negative for discharge, redness, vision loss in right eye and visual halos.  Cardiovascular: Negative for chest pain, dyspnea on exertion, leg swelling, orthopnea and palpitations.  Respiratory: Negative for cough, hemoptysis, shortness of breath and snoring.   Endocrine: Negative for heat intolerance and polyphagia.  Hematologic/Lymphatic: Negative for bleeding problem. Does not bruise/bleed easily.  Skin: Negative for  flushing, nail changes, rash and suspicious lesions.  Musculoskeletal: Negative for arthritis, joint pain, muscle cramps, myalgias, neck pain and stiffness.  Gastrointestinal: Negative for abdominal pain, bowel incontinence, diarrhea and excessive appetite.  Genitourinary: Negative for decreased libido, genital sores and incomplete emptying.  Neurological: Negative for brief paralysis, focal weakness, headaches and loss of balance.  Psychiatric/Behavioral: Negative for altered mental status, depression and suicidal ideas.  Allergic/Immunologic: Negative for HIV exposure and persistent infections.    EKGs/Labs/Other Studies Reviewed:    The following studies were reviewed today:   EKG:  The ekg ordered today demonstrates minus rhythm with PACs, heart rate 65 bpm with right-sided interventricular conduction defect to EKG done on February 22, 2022 no significant change.  CT head WO contrast IMPRESSION: 02/23/2019 Atrophy with mild periventricular small vessel disease. No acute infarct evident. No mass or hemorrhage. There are foci of arterial vascular calcification. There is mucosal thickening in several ethmoid air cells.  Recent Labs: 02/23/2019: ALT 14; BUN 14; Creatinine, Ser 0.64; Hemoglobin 13.3; Platelets 268; Potassium 4.2; Sodium 138  Recent Lipid Panel No results found for: CHOL, TRIG, HDL, CHOLHDL, VLDL, LDLCALC, LDLDIRECT  Physical Exam:    VS:  BP (!) 192/90 (BP Location: Left Arm, Patient Position: Sitting, Cuff Size: Normal)   Pulse 60   Ht 5\' 4"  (1.626 m)   Wt 109 lb (49.4 kg)   SpO2 95%   BMI 18.71 kg/m     Wt Readings from Last 3 Encounters:  03/03/19 109 lb (49.4 kg)  02/23/19 108 lb (49 kg)  10/01/12 114 lb (51.7 kg)     GEN: Well nourished, well developed in no acute distress HEENT: Normal NECK: No JVD; No carotid bruits LYMPHATICS: No lymphadenopathy CARDIAC: S1S2 noted,RRR, no murmurs, rubs, gallops RESPIRATORY:  Clear to auscultation without rales,  wheezing or rhonchi  ABDOMEN: Soft, non-tender, non-distended, +bowel sounds, no guarding. EXTREMITIES: No edema, No cyanosis, no clubbing MUSCULOSKELETAL:  No edema; No deformity  SKIN: Warm and dry NEUROLOGIC:  Alert and oriented x 3, non-focal PSYCHIATRIC:  Normal affect, good insight  ASSESSMENT:    1. Syncope and collapse   2. Essential hypertension   3. Mixed hyperlipidemia   4. PAC (premature atrial contraction)    PLAN:    1.  Syncope-the patient has not had any repeat episode.  However at this time, I would like to rule out a cardiovascular etiology of this syncope, therefore at this time I would like to placed a zio patch for 7 days. In additon a transthoracic echocardiogram will be ordered to assess LV/RV function and any structural abnormalities. Once these testing have been performed amd reviewed further reccomendations will be  made. For now, I do reccomend that the patient goes to the nearest ED if  symptoms recur.    I did discuss the [place] DMV medical guidelines for driving: "it is prudent to recommend that all persons should be free of syncopal episodes for at least six months to be granted the driving privilege." ([place], Second Edition, Medical Review Branch, Engineer, site, Division of [employer] of Transportation, July 2004)  2.  She is hypertensive in the office today-she has been started on telmisartan 40 mg daily.  But the patient does not take this medication.  She does have this medication with her today in the office of asked her to take a pill prior to going home.  She was brought in by her neighbor.  She will continue to take this medication as prescribed advised her that this is okay as long as she is not having any lightheadedness dizziness which she denies.  Follow-up in 1 month at this time she is going to bring her home blood pressure monitoring cuff where we can do a  comparison with her because she tells me that her static blood pressure usually at home is in the 120s.  3.  Premature atrial complexes-have educated patient about this ending of her EKG.  She denies any symptoms with this.  We will continue to monitor.  4.  Hyperlipidemia-she is currently not on a statin.  Her last lipid profile in March 2018 showed LDL 92, triglycerides 70, HDL 71, and total cholesterol of 177.  For now we will repeat this in the future be able to assess any need for further statin therapy.   The patient is in agreement with the above plan. The patient left the office in stable condition.  The patient will follow up in 1 month.   Medication Adjustments/Labs and Tests Ordered: Current medicines are reviewed at length with the patient today.  Concerns regarding medicines are outlined above.  No orders of the defined types were placed in this encounter.  No orders of the defined types were placed in this encounter.   There are no Patient Instructions on file for this visit.   Adopting a Healthy Lifestyle.  Know what a healthy weight is for you (roughly BMI <25) and aim to maintain this   Aim for 7+ servings of fruits and vegetables daily   65-80+ fluid ounces of water or unsweet tea for healthy kidneys   Limit to max 1 drink of alcohol per day; avoid smoking/tobacco   Limit animal fats in diet for cholesterol and heart health - choose grass fed whenever available   Avoid highly processed foods, and foods high in saturated/trans fats   Aim for low stress - take time to unwind and care for your mental health   Aim for 150 min of moderate intensity exercise weekly for heart health, and weights twice weekly for bone health   Aim for 7-9 hours of sleep daily   When it comes to diets, agreement about the perfect plan isnt easy to find, even among the experts. Experts at the [employer] developed an idea known as the Healthy Eating Plate. Just  imagine a plate divided into logical, healthy portions.   The emphasis is on diet quality:   Load up on vegetables and fruits - one-half of your plate: Aim for color and variety, and remember that potatoes dont count.   Go for  whole grains - one-quarter of your plate: Whole wheat, barley, wheat berries, quinoa, oats, brown rice, and foods made with them. If you want pasta, go with whole wheat pasta.   Protein power - one-quarter of your plate: Fish, chicken, beans, and nuts are all healthy, versatile protein sources. Limit red meat.   The diet, however, does go beyond the plate, offering a few other suggestions.   Use healthy plant oils, such as olive, canola, soy, corn, sunflower and peanut. Check the labels, and avoid partially hydrogenated oil, which have unhealthy trans fats.   If youre thirsty, drink water. Coffee and tea are good in moderation, but skip sugary drinks and limit milk and dairy products to one or two daily servings.   The type of carbohydrate in the diet is more important than the amount. Some sources of carbohydrates, such as vegetables, fruits, whole grains, and beans-are healthier than others.   Finally, stay active  Signed, Berniece Salines, DO  03/03/2019 12:03 PM    [place]

## 2019-03-03 NOTE — Patient Instructions (Addendum)
Medication Instructions:  Your physician recommends that you continue on your current medications as directed. Please refer to the Current Medication list given to you today.  *If you need a refill on your cardiac medications before your next appointment, please call your pharmacy*  Lab Work: None If you have labs (blood work) drawn today and your tests are completely normal, you will receive your results only by: Marland Kitchen MyChart Message (if you have MyChart) OR . A paper copy in the mail If you have any lab test that is abnormal or we need to change your treatment, we will call you to review the results.  Testing/Procedures: Your physician has requested that you have an echocardiogram. Echocardiography is a painless test that uses sound waves to create images of your heart. It provides your doctor with information about the size and shape of your heart and how well your heart's chambers and valves are working. This procedure takes approximately one hour. There are no restrictions for this procedure.  A zio monitor was placed today. It will remain on for 7  days. You will then return monitor and event diary in provided box. It takes 1-2 weeks for report to be downloaded and returned to [place]. We will call you with the results. If monitor falls off or has orange flashing light, please call Zio for further instructions.     Follow-Up: At [facility], you and your health needs are our priority.  As part of our continuing mission to provide you with exceptional heart care, we have created designated Provider Care Teams.  These Care Teams include your primary Cardiologist (physician) and Advanced Practice Providers (APPs -  Physician Assistants and Nurse Practitioners) who all work together to provide you with the care you need, when you need it.  Your next appointment:   1 month  The format for your next appointment:   In Person  Provider:   Berniece Salines, DO  Other Instructions  No driving    Echocardiogram An echocardiogram is a procedure that uses painless sound waves (ultrasound) to produce an image of the heart. Images from an echocardiogram can provide important information about:  Signs of coronary artery disease (CAD).  Aneurysm detection. An aneurysm is a weak or damaged part of an artery wall that bulges out from the normal force of blood pumping through the body.  Heart size and shape. Changes in the size or shape of the heart can be associated with certain conditions, including heart failure, aneurysm, and CAD.  Heart muscle function.  Heart valve function.  Signs of a past heart attack.  Fluid buildup around the heart.  Thickening of the heart muscle.  A tumor or infectious growth around the heart valves. Tell a health care provider about:  Any allergies you have.  All medicines you are taking, including vitamins, herbs, eye drops, creams, and over-the-counter medicines.  Any blood disorders you have.  Any surgeries you have had.  Any medical conditions you have.  Whether you are pregnant or may be pregnant. What are the risks? Generally, this is a safe procedure. However, problems may occur, including:  Allergic reaction to dye (contrast) that may be used during the procedure. What happens before the procedure? No specific preparation is needed. You may eat and drink normally. What happens during the procedure?   An IV tube may be inserted into one of your veins.  You may receive contrast through this tube. A contrast is an injection that improves the quality of  the pictures from your heart.  A gel will be applied to your chest.  A wand-like tool (transducer) will be moved over your chest. The gel will help to transmit the sound waves from the transducer.  The sound waves will harmlessly bounce off of your heart to allow the heart images to be captured in real-time motion. The images will be recorded on a computer. The procedure may vary  among health care providers and hospitals. What happens after the procedure?  You may return to your normal, everyday life, including diet, activities, and medicines, unless your health care provider tells you not to do that. Summary  An echocardiogram is a procedure that uses painless sound waves (ultrasound) to produce an image of the heart.  Images from an echocardiogram can provide important information about the size and shape of your heart, heart muscle function, heart valve function, and fluid buildup around your heart.  You do not need to do anything to prepare before this procedure. You may eat and drink normally.  After the echocardiogram is completed, you may return to your normal, everyday life, unless your health care provider tells you not to do that. This information is not intended to replace advice given to you by your health care provider. Make sure you discuss any questions you have with your health care provider. Document Released: 04/06/2000 Document Revised: 07/31/2018 Document Reviewed: 05/12/2016 Elsevier Patient Education  2020 [employer].

## 2019-03-03 NOTE — Addendum Note (Signed)
Addended by: Particia Nearing B on: 03/03/2019 12:24 PM   Modules accepted: Orders

## 2019-03-10 ENCOUNTER — Ambulatory Visit (HOSPITAL_BASED_OUTPATIENT_CLINIC_OR_DEPARTMENT_OTHER)
Admission: RE | Admit: 2019-03-10 | Discharge: 2019-03-10 | Disposition: A | Discharge status: Home / Self Care | Payer: Medicare Other | Source: Ambulatory Visit | Attending: Cardiology | Admitting: Cardiology

## 2019-03-10 DIAGNOSIS — I491 Atrial premature depolarization: Secondary | ICD-10-CM

## 2019-03-10 DIAGNOSIS — R55 Syncope and collapse: Secondary | ICD-10-CM | POA: Insufficient documentation

## 2019-03-10 DIAGNOSIS — I10 Essential (primary) hypertension: Secondary | ICD-10-CM | POA: Diagnosis not present

## 2019-03-10 NOTE — Progress Notes (Signed)
  Echocardiogram 2D Echocardiogram has been performed.   Kristina Bruce 03/10/2019, 11:49 AM

## 2019-03-11 ENCOUNTER — Telehealth: Payer: Self-pay | Admitting: *Deleted

## 2019-03-11 NOTE — Telephone Encounter (Signed)
Patient returned your call.

## 2019-03-11 NOTE — Telephone Encounter (Signed)
Telephone call to patient. Informed of  Echo results.

## 2019-03-11 NOTE — Telephone Encounter (Signed)
Left message to return call regarding echo results.

## 2019-03-11 NOTE — Telephone Encounter (Signed)
-----   Message from Berniece Salines, DO sent at 03/10/2019 10:19 PM EST ----- The echo showed that the heart is not fully relaxing like it should ( diastolic dysfunction) ,but otherwise normal. I will discuss it at the next office visit.

## 2019-03-20 DIAGNOSIS — R002 Palpitations: Secondary | ICD-10-CM | POA: Diagnosis not present

## 2019-03-31 ENCOUNTER — Other Ambulatory Visit: Payer: Self-pay

## 2019-03-31 ENCOUNTER — Encounter: Payer: Self-pay | Admitting: Cardiology

## 2019-03-31 ENCOUNTER — Ambulatory Visit (INDEPENDENT_AMBULATORY_CARE_PROVIDER_SITE_OTHER): Payer: Medicare Other | Admitting: Cardiology

## 2019-03-31 VITALS — BP 164/94 | HR 65 | Ht 64.0 in | Wt 108.0 lb

## 2019-03-31 DIAGNOSIS — I10 Essential (primary) hypertension: Secondary | ICD-10-CM

## 2019-03-31 DIAGNOSIS — I471 Supraventricular tachycardia: Secondary | ICD-10-CM | POA: Diagnosis not present

## 2019-03-31 MED ORDER — CARVEDILOL 6.25 MG PO TABS: 6.2500 mg | ORAL_TABLET | Freq: Two times a day (BID) | ORAL | 3 refills | Status: DC

## 2019-03-31 NOTE — Patient Instructions (Signed)
Medication Instructions:  Your physician has recommended you make the following change in your medication:   START: Coreg(carvedilol) 6.25mg  Take 1 tab twice daily   *If you need a refill on your cardiac medications before your next appointment, please call your pharmacy*  Lab Work: NOne  If you have labs (blood work) drawn today and your tests are completely normal, you will receive your results only by: Kristina Bruce MyChart Message (if you have MyChart) OR . A paper copy in the mail If you have any lab test that is abnormal or we need to change your treatment, we will call you to review the results.  Testing/Procedures: None  Follow-Up: At [facility], you and your health needs are our priority.  As part of our continuing mission to provide you with exceptional heart care, we have created designated Provider Care Teams.  These Care Teams include your primary Cardiologist (physician) and Advanced Practice Providers (APPs -  Physician Assistants and Nurse Practitioners) who all work together to provide you with the care you need, when you need it.  Your next appointment:   3 month(s)  The format for your next appointment:   In Person  Provider:   Berniece Salines, DO  Other Instructions Carvedilol tablets What is this medicine? CARVEDILOL (KAR ve dil ol) is a beta-blocker. Beta-blockers reduce the workload on the heart and help it to beat more regularly. This medicine is used to treat high blood pressure and heart failure. This medicine may be used for other purposes; ask your health care provider or pharmacist if you have questions. COMMON BRAND NAME(S): Coreg What should I tell my health care provider before I take this medicine? They need to know if you have any of these conditions:  circulation problems  diabetes  history of heart attack or heart disease  liver disease  lung or breathing disease, like asthma or emphysema  pheochromocytoma  slow or irregular  heartbeat  thyroid disease  an unusual or allergic reaction to carvedilol, other beta-blockers, medicines, foods, dyes, or preservatives  pregnant or trying to get pregnant  breast-feeding How should I use this medicine? Take this medicine by mouth with a glass of water. Follow the directions on the prescription label. It is best to take the tablets with food. Take your doses at regular intervals. Do not take your medicine more often than directed. Do not stop taking except on the advice of your doctor or health care professional. Talk to your pediatrician regarding the use of this medicine in children. Special care may be needed. Overdosage: If you think you have taken too much of this medicine contact a poison control center or emergency room at once. NOTE: This medicine is only for you. Do not share this medicine with others. What if I miss a dose? If you miss a dose, take it as soon as you can. If it is almost time for your next dose, take only that dose. Do not take double or extra doses. What may interact with this medicine? This medicine may interact with the following medications:  certain medicines for blood pressure, heart disease, irregular heart beat  certain medicines for depression, like fluoxetine or paroxetine  certain medicines for diabetes, like glipizide or glyburide  cimetidine  clonidine  cyclosporine  digoxin  MAOIs like Carbex, Eldepryl, Marplan, Nardil, and Parnate  reserpine  rifampin This list may not describe all possible interactions. Give your health care provider a list of all the medicines, herbs, non-prescription drugs, or dietary  supplements you use. Also tell them if you smoke, drink alcohol, or use illegal drugs. Some items may interact with your medicine. What should I watch for while using this medicine? Check your heart rate and blood pressure regularly while you are taking this medicine. Ask your doctor or health care professional what  your heart rate and blood pressure should be, and when you should contact him or her. Do not stop taking this medicine suddenly. This could lead to serious heart-related effects. Contact your doctor or health care professional if you have difficulty breathing while taking this drug. Check your weight daily. Ask your doctor or health care professional when you should notify him/her of any weight gain. You may get drowsy or dizzy. Do not drive, use machinery, or do anything that requires mental alertness until you know how this medicine affects you. To reduce the risk of dizzy or fainting spells, do not sit or stand up quickly. Alcohol can make you more drowsy, and increase flushing and rapid heartbeats. Avoid alcoholic drinks. This medicine may increase blood sugar. Ask your healthcare provider if changes in diet or medicines are needed if you have diabetes. If you are going to have surgery, tell your doctor or health care professional that you are taking this medicine. What side effects may I notice from receiving this medicine? Side effects that you should report to your doctor or health care professional as soon as possible:  allergic reactions like skin rash, itching or hives, swelling of the face, lips, or tongue  breathing problems  dark urine  irregular heartbeat   signs and symptoms of high blood sugar such as being more thirsty or hungry or having to urinate more than normal. You may also feel very tired or have blurry vision.  swollen legs or ankles  vomiting  yellowing of the eyes or skin Side effects that usually do not require medical attention (report to your doctor or health care professional if they continue or are bothersome):  change in sex drive or performance  diarrhea  dry eyes (especially if wearing contact lenses)  dry, itching skin  headache  nausea  unusually tired This list may not describe all possible side effects. Call your doctor for medical advice  about side effects. You may report side effects to FDA at 1-800-FDA-1088. Where should I keep my medicine? Keep out of the reach of children. Store at room temperature below 30 degrees C (86 degrees F). Protect from moisture. Keep container tightly closed. Throw away any unused medicine after the expiration date. NOTE: This sheet is a summary. It may not cover all possible information. If you have questions about this medicine, talk to your doctor, pharmacist, or health care provider.  2020 Elsevier/Gold Standard (2018-01-29 09:13:57)

## 2019-03-31 NOTE — Progress Notes (Addendum)
Cardiology Office Note:    Date:  03/31/2019   ID:  Kristina Bruce, DOB [birth year], MRN [id number]  PCP:  Glenford Bayley, DO  Cardiologist:  Berniece Salines, DO  Electrophysiologist:  None   Referring MD: Glenford Bayley, DO   Chief Complaint  Patient presents with  . Follow-up    History of Present Illness:    Kristina Bruce is a 85 y.o. female with a hx of hypertension, hyperlipidemia presented to be evaluated for a syncope episode. The patient reported she experienced a syncope episode on id on February 23, 2019.  At the conclusion of the visit I recommended the patient undergo a transthoracic echocardiogram as well as a Zio monitor.   In the interim she was able to wear her monitor as well as an echocardiogram done.  She is her for a follow up visit and offers no complaints at this time.    Past Medical History:  Diagnosis Date  . Allergic rhinitis   . Coronary atherosclerosis of native coronary artery   . Essential hypertension, benign   . Essential hypertension, benign   . Intermediate coronary syndrome ([place])   . Osteopenia    with Vitamin D Deficiency     No past surgical history on file.  Current Medications: Current Meds  Medication Sig  . Calcium Carbonate-Vitamin D (CALCIUM + D PO) Take by mouth daily.  . Cholecalciferol (VITAMIN D) 2000 UNITS tablet Take 2,000 Units by mouth daily.  . fexofenadine (ALLEGRA) 60 MG tablet Take 60 mg by mouth 2 (two) times daily.  . fluticasone (FLONASE) 50 MCG/ACT nasal spray Place 1 spray into both nostrils daily.  Marland Kitchen telmisartan (MICARDIS) 80 MG tablet Take 80 mg by mouth daily.     Allergies:   Patient has no known allergies.   Social History   Socioeconomic History  . Marital status: Married    Spouse name: Not on file  . Number of children: Not on file  . Years of education: Not on file  . Highest education level: Not on file  Occupational History  . Not on file  Social Needs  . Financial resource strain: Not on file   . Food insecurity    Worry: Not on file    Inability: Not on file  . Transportation needs    Medical: Not on file    Non-medical: Not on file  Tobacco Use  . Smoking status: Never Smoker  . Smokeless tobacco: Never Used  Substance and Sexual Activity  . Alcohol use: No  . Drug use: No  . Sexual activity: Not on file  Lifestyle  . Physical activity    Days per week: Not on file    Minutes per session: Not on file  . Stress: Not on file  Relationships  . Social Herbalist on phone: Not on file    Gets together: Not on file    Attends religious service: Not on file    Active member of club or organization: Not on file    Attends meetings of clubs or organizations: Not on file    Relationship status: Not on file  Other Topics Concern  . Not on file  Social History Narrative  . Not on file     Family History: The patient's family history includes Breast cancer in her mother; Heart attack in her son.  ROS:   Review of Systems  Constitution: Negative for decreased appetite, fever and weight gain.  HENT:  Negative for congestion, ear discharge, hoarse voice and sore throat.   Eyes: Negative for discharge, redness, vision loss in right eye and visual halos.  Cardiovascular: Negative for chest pain, dyspnea on exertion, leg swelling, orthopnea and palpitations.  Respiratory: Negative for cough, hemoptysis, shortness of breath and snoring.   Endocrine: Negative for heat intolerance and polyphagia.  Hematologic/Lymphatic: Negative for bleeding problem. Does not bruise/bleed easily.  Skin: Negative for flushing, nail changes, rash and suspicious lesions.  Musculoskeletal: Negative for arthritis, joint pain, muscle cramps, myalgias, neck pain and stiffness.  Gastrointestinal: Negative for abdominal pain, bowel incontinence, diarrhea and excessive appetite.  Genitourinary: Negative for decreased libido, genital sores and incomplete emptying.  Neurological: Negative for  brief paralysis, focal weakness, headaches and loss of balance.  Psychiatric/Behavioral: Negative for altered mental status, depression and suicidal ideas.  Allergic/Immunologic: Negative for HIV exposure and persistent infections.    EKGs/Labs/Other Studies Reviewed:    The following studies were reviewed today:   EKG: None today  TTE 03/10/2019 IMPRESSIONS Transthoracic echocardiogram:  1. Left ventricular ejection fraction, by visual estimation, is 60 to 65%. The left ventricle has normal function. There is no left ventricular hypertrophy.  2. Left ventricular diastolic parameters are consistent with Grade I diastolic dysfunction (impaired relaxation).  3. The mitral valve is normal in structure. Mild mitral valve regurgitation. No evidence of mitral stenosis.  4. The aortic valve is normal in structure. Aortic valve regurgitation is mild. No evidence of aortic valve sclerosis or stenosis.  FINDINGS  Left Ventricle: Left ventricular ejection fraction, by visual estimation, is 60 to 65%. The left ventricle has normal function. There is no left ventricular hypertrophy. Left ventricular diastolic parameters are consistent with Grade I diastolic  dysfunction (impaired relaxation). Normal left atrial pressure.  Right Ventricle: The right ventricular size is normal. No increase in right ventricular wall thickness. Global RV systolic function is has normal systolic function. The tricuspid regurgitant velocity is 2.10 m/s, and with an assumed right atrial pressure  of 3 mmHg, the estimated right ventricular systolic pressure is normal at 20.7 mmHg.  Left Atrium: Left atrial size was normal in size.  Right Atrium: Right atrial size was normal in size  Pericardium: There is no evidence of pericardial effusion.  Mitral Valve: The mitral valve is normal in structure. No evidence of mitral valve stenosis by observation. Mild mitral valve regurgitation.  Tricuspid Valve: The tricuspid  valve is normal in structure. Tricuspid valve regurgitation is not demonstrated.  Aortic Valve: The aortic valve is normal in structure. Aortic valve regurgitation is mild. Aortic regurgitation PHT measures 548 msec. The aortic valve is structurally normal, with no evidence of sclerosis or stenosis.  Pulmonic Valve: The pulmonic valve was normal in structure. Pulmonic valve regurgitation is not visualized.  Aorta: The aortic root, ascending aorta and aortic arch are all structurally normal, with no evidence of dilitation or obstruction.  Venous: The inferior vena cava is normal in size with greater than 50% respiratory variability, suggesting right atrial pressure of 3 mmHg.  IAS/Shunts: No atrial level shunt detected by color flow Doppler. No ventricular septal defect is seen or detected. There is no evidence of an atrial septal defect.  Zio monitor.  The patient wore the monitor for 7 days and 2 hours starting 03/03/2019. Indications: Palpitations The minimum heart rate was 38 bpm and the maximum heart rate was 179 bpm and the average heart rate was 67 bpm. The predominant underlying rhythm was Sinus Rhythm.  687 Supraventricular Tachycardia  runs occurred, the run with the fastest interval lasting 7 beats with a max rate of 179 bpm, the longest lasting 16 beats with an average rate of 107 bpm.  Premature atrial complexes were  frequent (7.8%,50633). Premature atrial  Couplets were occasional (1.1%, 3556). Premature ventricular complexes were rare (<1.0%). No ventricular tachycardia, No pauses, No AV blocks and no atrial fibrillation.  No patient trigger events were noted.  Conclusion: This study was remarkable for 687 supraventricular tachycardia which is likely atrial tachycardia. There was also frequent premature atrial complexes.  Recent Labs: 02/23/2019: ALT 14; BUN 14; Creatinine, Ser 0.64; Hemoglobin 13.3; Platelets 268; Potassium 4.2; Sodium 138  Recent Lipid Panel No  results found for: CHOL, TRIG, HDL, CHOLHDL, VLDL, LDLCALC, LDLDIRECT  Physical Exam:    VS:  BP (!) 164/94 (BP Location: Right Arm, Patient Position: Sitting, Cuff Size: Normal)   Pulse 65   Ht 5\' 4"  (1.626 m)   Wt 108 lb (49 kg)   SpO2 95%   BMI 18.54 kg/m     Wt Readings from Last 3 Encounters:  03/31/19 108 lb (49 kg)  03/03/19 109 lb (49.4 kg)  02/23/19 108 lb (49 kg)     GEN: Well nourished, well developed in no acute distress HEENT: Normal NECK: No JVD; No carotid bruits LYMPHATICS: No lymphadenopathy CARDIAC: S1S2 noted,RRR, no murmurs, rubs, gallops RESPIRATORY:  Clear to auscultation without rales, wheezing or rhonchi  ABDOMEN: Soft, non-tender, non-distended, +bowel sounds, no guarding. EXTREMITIES: No edema, No cyanosis, no clubbing MUSCULOSKELETAL:  No edema; No deformity  SKIN: Warm and dry NEUROLOGIC:  Alert and oriented x 3, non-focal PSYCHIATRIC:  Normal affect, good insight  ASSESSMENT:    1. Paroxysmal atrial tachycardia ([place])   2. Essential hypertension    PLAN:    1.  Her blood pressure is elevated in the office today even though this is an improvement to her last visit.  With the episodes of paroxysmal atrial tachycardia on her monitor I like to add the carvedilol 6.25 mg twice daily to the patient's medication regimen.  She will remain also on her telmisartan 80 mg daily as well.  Educated patient about the side effects of her new medication.  And all of her questions were answered.    The patient is in agreement with the above plan. The patient left the office in stable condition.  The patient will follow up in 3 months or sooner if needed   Medication Adjustments/Labs and Tests Ordered: Current medicines are reviewed at length with the patient today.  Concerns regarding medicines are outlined above.  No orders of the defined types were placed in this encounter.  Meds ordered this encounter  Medications  . carvedilol (COREG) 6.25 MG tablet     Sig: Take 1 tablet (6.25 mg total) by mouth 2 (two) times daily.    Dispense:  180 tablet    Refill:  3    Patient Instructions  Medication Instructions:  Your physician has recommended you make the following change in your medication:   START: Coreg(carvedilol) 6.25mg  Take 1 tab twice daily   *If you need a refill on your cardiac medications before your next appointment, please call your pharmacy*  Lab Work: NOne  If you have labs (blood work) drawn today and your tests are completely normal, you will receive your results only by: Marland Kitchen MyChart Message (if you have MyChart) OR . A paper copy in the mail If you have any lab test that is abnormal  or we need to change your treatment, we will call you to review the results.  Testing/Procedures: None  Follow-Up: At [facility], you and your health needs are our priority.  As part of our continuing mission to provide you with exceptional heart care, we have created designated Provider Care Teams.  These Care Teams include your primary Cardiologist (physician) and Advanced Practice Providers (APPs -  Physician Assistants and Nurse Practitioners) who all work together to provide you with the care you need, when you need it.  Your next appointment:   3 month(s)  The format for your next appointment:   In Person  Provider:   Berniece Salines, DO  Other Instructions Carvedilol tablets What is this medicine? CARVEDILOL (KAR ve dil ol) is a beta-blocker. Beta-blockers reduce the workload on the heart and help it to beat more regularly. This medicine is used to treat high blood pressure and heart failure. This medicine may be used for other purposes; ask your health care provider or pharmacist if you have questions. COMMON BRAND NAME(S): Coreg What should I tell my health care provider before I take this medicine? They need to know if you have any of these conditions:  circulation problems  diabetes  history of heart attack or heart  disease  liver disease  lung or breathing disease, like asthma or emphysema  pheochromocytoma  slow or irregular heartbeat  thyroid disease  an unusual or allergic reaction to carvedilol, other beta-blockers, medicines, foods, dyes, or preservatives  pregnant or trying to get pregnant  breast-feeding How should I use this medicine? Take this medicine by mouth with a glass of water. Follow the directions on the prescription label. It is best to take the tablets with food. Take your doses at regular intervals. Do not take your medicine more often than directed. Do not stop taking except on the advice of your doctor or health care professional. Talk to your pediatrician regarding the use of this medicine in children. Special care may be needed. Overdosage: If you think you have taken too much of this medicine contact a poison control center or emergency room at once. NOTE: This medicine is only for you. Do not share this medicine with others. What if I miss a dose? If you miss a dose, take it as soon as you can. If it is almost time for your next dose, take only that dose. Do not take double or extra doses. What may interact with this medicine? This medicine may interact with the following medications:  certain medicines for blood pressure, heart disease, irregular heart beat  certain medicines for depression, like fluoxetine or paroxetine  certain medicines for diabetes, like glipizide or glyburide  cimetidine  clonidine  cyclosporine  digoxin  MAOIs like Carbex, Eldepryl, Marplan, Nardil, and Parnate  reserpine  rifampin This list may not describe all possible interactions. Give your health care provider a list of all the medicines, herbs, non-prescription drugs, or dietary supplements you use. Also tell them if you smoke, drink alcohol, or use illegal drugs. Some items may interact with your medicine. What should I watch for while using this medicine? Check your heart  rate and blood pressure regularly while you are taking this medicine. Ask your doctor or health care professional what your heart rate and blood pressure should be, and when you should contact him or her. Do not stop taking this medicine suddenly. This could lead to serious heart-related effects. Contact your doctor or health care professional if you have  difficulty breathing while taking this drug. Check your weight daily. Ask your doctor or health care professional when you should notify him/her of any weight gain. You may get drowsy or dizzy. Do not drive, use machinery, or do anything that requires mental alertness until you know how this medicine affects you. To reduce the risk of dizzy or fainting spells, do not sit or stand up quickly. Alcohol can make you more drowsy, and increase flushing and rapid heartbeats. Avoid alcoholic drinks. This medicine may increase blood sugar. Ask your healthcare provider if changes in diet or medicines are needed if you have diabetes. If you are going to have surgery, tell your doctor or health care professional that you are taking this medicine. What side effects may I notice from receiving this medicine? Side effects that you should report to your doctor or health care professional as soon as possible:  allergic reactions like skin rash, itching or hives, swelling of the face, lips, or tongue  breathing problems  dark urine  irregular heartbeat   signs and symptoms of high blood sugar such as being more thirsty or hungry or having to urinate more than normal. You may also feel very tired or have blurry vision.  swollen legs or ankles  vomiting  yellowing of the eyes or skin Side effects that usually do not require medical attention (report to your doctor or health care professional if they continue or are bothersome):  change in sex drive or performance  diarrhea  dry eyes (especially if wearing contact lenses)  dry, itching skin  headache   nausea  unusually tired This list may not describe all possible side effects. Call your doctor for medical advice about side effects. You may report side effects to FDA at 1-800-FDA-1088. Where should I keep my medicine? Keep out of the reach of children. Store at room temperature below 30 degrees C (86 degrees F). Protect from moisture. Keep container tightly closed. Throw away any unused medicine after the expiration date. NOTE: This sheet is a summary. It may not cover all possible information. If you have questions about this medicine, talk to your doctor, pharmacist, or health care provider.  2020 Elsevier/Gold Standard (2018-01-29 09:13:57)      Adopting a Healthy Lifestyle.  Know what a healthy weight is for you (roughly BMI <25) and aim to maintain this   Aim for 7+ servings of fruits and vegetables daily   65-80+ fluid ounces of water or unsweet tea for healthy kidneys   Limit to max 1 drink of alcohol per day; avoid smoking/tobacco   Limit animal fats in diet for cholesterol and heart health - choose grass fed whenever available   Avoid highly processed foods, and foods high in saturated/trans fats   Aim for low stress - take time to unwind and care for your mental health   Aim for 150 min of moderate intensity exercise weekly for heart health, and weights twice weekly for bone health   Aim for 7-9 hours of sleep daily   When it comes to diets, agreement about the perfect plan isnt easy to find, even among the experts. Experts at the [employer] developed an idea known as the Healthy Eating Plate. Just imagine a plate divided into logical, healthy portions.   The emphasis is on diet quality:   Load up on vegetables and fruits - one-half of your plate: Aim for color and variety, and remember that potatoes dont count.   Go for  whole grains - one-quarter of your plate: Whole wheat, barley, wheat berries, quinoa, oats, brown rice, and foods made  with them. If you want pasta, go with whole wheat pasta.   Protein power - one-quarter of your plate: Fish, chicken, beans, and nuts are all healthy, versatile protein sources. Limit red meat.   The diet, however, does go beyond the plate, offering a few other suggestions.   Use healthy plant oils, such as olive, canola, soy, corn, sunflower and peanut. Check the labels, and avoid partially hydrogenated oil, which have unhealthy trans fats.   If youre thirsty, drink water. Coffee and tea are good in moderation, but skip sugary drinks and limit milk and dairy products to one or two daily servings.   The type of carbohydrate in the diet is more important than the amount. Some sources of carbohydrates, such as vegetables, fruits, whole grains, and beans-are healthier than others.   Finally, stay active  Signed, Berniece Salines, DO  03/31/2019 11:44 AM    [place]

## 2019-04-13 ENCOUNTER — Telehealth: Payer: Self-pay | Admitting: Cardiology

## 2019-04-13 ENCOUNTER — Other Ambulatory Visit: Payer: Self-pay | Admitting: *Deleted

## 2019-04-13 MED ORDER — CARVEDILOL 6.25 MG PO TABS: 6.2500 mg | ORAL_TABLET | Freq: Two times a day (BID) | ORAL | 1 refills | Status: DC

## 2019-04-13 NOTE — Telephone Encounter (Signed)
*  STAT* If patient is at the pharmacy, call can be transferred to refill team.   1. Which medications need to be refilled? (please list name of each medication and dose if known) carvedilol  2. Which pharmacy/location (including street and city if local pharmacy) is medication to be sent to?[facility] in [place]  3. Do they need a 30 day or 90 day supply? [place]

## 2019-04-13 NOTE — Telephone Encounter (Signed)
Refill sent.

## 2019-04-14 ENCOUNTER — Telehealth: Payer: Self-pay | Admitting: Cardiology

## 2019-04-14 NOTE — Telephone Encounter (Signed)
Telephone call to patient. Patient is having no problems. Her daughter read about side  effects of carvedilol and patient wanted to be sure it was safe to take. Informed her that carvedilol is safe but if she does have side effects she can call the office at any time to discuss it . No further questions

## 2019-04-14 NOTE — Telephone Encounter (Signed)
New Message    Pt is calling would like for the nurse to call her back, she says it is not urgent, She is needing some advice    Please call

## 2019-04-23 ENCOUNTER — Telehealth: Payer: Self-pay | Admitting: Cardiology

## 2019-04-23 NOTE — Telephone Encounter (Signed)
Pt reports she has noticed some fatigue, nausea and headache since starting carvedilol.  She has been able to manage symptoms and they are improving.  She had imposed personal ban on driving and is wondering if she is ok to drive.  Informed if she is feeling well enough to drive there is no indication that she cannot drive, however if she is having dizziness or lightheadedness she should continue to avoid driving.

## 2019-04-23 NOTE — Telephone Encounter (Signed)
New Message:    Please call, she have some questions about the side effects of her Carvedilol. She says she just needs some advice.

## 2019-05-08 ENCOUNTER — Telehealth: Payer: Self-pay | Admitting: Cardiology

## 2019-05-08 MED ORDER — DILTIAZEM HCL ER COATED BEADS 120 MG PO CP24: 120.0000 mg | ORAL_CAPSULE | Freq: Every day | ORAL | 1 refills | Status: DC

## 2019-05-08 NOTE — Telephone Encounter (Signed)
New Message     Pt c/o medication issue:  1. Name of Medication: carvedilol (COREG) 6.25 MG tablet  2. How are you currently taking this medication (dosage and times per day)? 1 tablet 2 x daily  Pt has not took her dose today yet   3. Are you having a reaction (difficulty breathing--STAT)? No  4. What is your medication issue? Pt is calling and says she thinks her side effects are from this medication. She says she has been dizzy, the tips of her fingers are numb, having some diarrhea, dry eyes, and nose bleeds. She is wondering if the  Dosage needs to be change    Please advise

## 2019-05-08 NOTE — Telephone Encounter (Signed)
Yes, diarrhea, numbness in fingers and dizziness are all associated with beta blockers. Would consider stopping and starting diltiazem 120mg  daily since indication of beta blocker was for afib. If blood pressure remains elevated, she has room to titrate telmisartan. I will route to Dr. Harriet Masson for her input.

## 2019-05-08 NOTE — Telephone Encounter (Signed)
Telephone call to patient. Informed to stop beta blocker Coreg and start on Diltiazem CD 120 Take 1 tab daily. Pt to call back and make appointment for fist of Feb when we re in the [facility] office. Pt verbalized understanding and prescription called into [facility] in [place]. Also instructed to take BP daily and bring to visit

## 2019-05-08 NOTE — Telephone Encounter (Signed)
Called patient about her message. Patient stated her BP 163/82 and HR 91. Patient stated she has not taken her carvedilol today because she thinks it is causing her some dizziness, numbness in finger tips, some diarrhea, dry eyes, and nose bleeds. Patient has been taking medication for over a month. Patient stated all these side effects came on gradually. Informed patient that she needs to take her carvedilol with food and with her BP elevated she should go ahead and take this mornings dose. Informed patient that if she starts to feel dizzy to check her BP and HR and call our office if it's too high or too low. Gave patient normal range for HR and BP. Patient stated these side effects are not all the time, but she thinks they are from her carvedilol will consult Pharm D for further advisement.

## 2019-05-08 NOTE — Telephone Encounter (Signed)
I am fine with the change.   Mickel Baas, Please set her up to see [place] with in a month instead of march due to the medication change. Please ask her to take her blood pressure daily and bring this with her at that visit.

## 2019-05-08 NOTE — Addendum Note (Signed)
Addended by: Particia Nearing B on: 05/08/2019 01:32 PM   Modules accepted: Orders

## 2019-05-15 ENCOUNTER — Ambulatory Visit: Payer: Medicare Other | Attending: Internal Medicine

## 2019-05-15 DIAGNOSIS — Z23 Encounter for immunization: Secondary | ICD-10-CM | POA: Insufficient documentation

## 2019-05-15 NOTE — Progress Notes (Signed)
   Covid-19 Vaccination Clinic  Name:  Kristina Bruce    MRN: [id number] DOB: 05-16-1930  05/15/2019  Ms. Field was observed post Covid-19 immunization for 15 minutes without incidence. She was provided with Vaccine Information Sheet and instruction to access the V-Safe system.   [name] was instructed to call 911 with any severe reactions post vaccine: Marland Kitchen Difficulty breathing  . Swelling of your face and throat  . A fast heartbeat  . A bad rash all over your body  . Dizziness and weakness    Immunizations Administered    Name Date Dose VIS Date Route   Pfizer COVID-19 Vaccine 05/15/2019  9:07 AM 0.3 mL 04/03/2019 Intramuscular   Manufacturer: [place]   Lot: [id number]   [place]: [id number]

## 2019-05-26 ENCOUNTER — Ambulatory Visit (INDEPENDENT_AMBULATORY_CARE_PROVIDER_SITE_OTHER): Payer: Medicare Other | Admitting: Cardiology

## 2019-05-26 ENCOUNTER — Other Ambulatory Visit: Payer: Self-pay

## 2019-05-26 ENCOUNTER — Encounter: Payer: Self-pay | Admitting: Cardiology

## 2019-05-26 VITALS — BP 170/100 | HR 81 | Ht 64.0 in | Wt 110.0 lb

## 2019-05-26 DIAGNOSIS — I471 Supraventricular tachycardia: Secondary | ICD-10-CM | POA: Diagnosis not present

## 2019-05-26 DIAGNOSIS — I491 Atrial premature depolarization: Secondary | ICD-10-CM | POA: Diagnosis not present

## 2019-05-26 DIAGNOSIS — I119 Hypertensive heart disease without heart failure: Secondary | ICD-10-CM

## 2019-05-26 DIAGNOSIS — I4719 Other supraventricular tachycardia: Secondary | ICD-10-CM

## 2019-05-26 NOTE — Progress Notes (Signed)
Cardiology Office Note:    Date:  05/26/2019   ID:  Kristina Bruce, DOB [birth year], MRN [id number]  PCP:  Glenford Bayley, DO  Cardiologist:  Berniece Salines, DO  Electrophysiologist:  None   Referring MD: Glenford Bayley, DO   Chief Complaint  Patient presents with  . Follow-up    History of Present Illness:    Kristina Bruce is a 86 y.o. female with a hx of hypertension, hyperlipidemia who I initially saw on March 03, 2019 after patient had experienced a syncope episode.  At the conclusion of her initial visit an echocardiogram and a ZIO monitor was ordered.  In addition at her initial visit she was quite hypertensive and she admitted that she had not been taking her antihypertensive medications telmisartan which had been previously prescribed for her.  At that time I discussed with the patient strongly that it was possible for her to take her antihypertensive medicine so we can be able to keep her blood pressure close to 130/80 is much as possible.  On March 31, 2019 I saw the patient at a follow-up visit she did have evidence of paroxysmal atrial tachycardia about 687 episodes, with premature atrial complexes were frequent therefore started the patient on carvedilol 6.25 mg twice daily in an effort help with her elevated blood pressure as well as slow down her heart rate in the setting of those findings.  In the interim on December 31,2020 the patient called and reported that she was experiencing significant nausea, and felt that may have had a nosebleed episode from her carvedilol.  We therefore change her to Cardizem 120 mg daily.  And requested the patient be seen.  Today she is here for follow-up visit.  The patient tells me that she has stopped taking the Cardizem 120 mg daily and restarted taking carvedilol 6.25 mg twice a day as she did have remaining medications from her initial prescription.  She states that she stopped taking her Cardizem due to not be able to get the prescription as  her pharmacy was out of medication.  She denies experiencing any nausea and tells me now she thinks that she is taking the medication the right way and has not been taking her medication on empty stomach.  She also believes that her nosebleed was the fact that she was not using her humidifier and since she started using this she has had no more recurrence of nosebleeding.  Unfortunately she has not been taking her telmisartan at all.   The patient reports that she had taken her first dose of Covid vaccine and is scheduled for the next dose later today.  She denies any chest pain, shortness of breath, lightheadedness or dizziness.  Past Medical History:  Diagnosis Date  . Allergic rhinitis   . Coronary atherosclerosis of native coronary artery   . Essential hypertension, benign   . Essential hypertension, benign   . Intermediate coronary syndrome ([place])   . Osteopenia    with Vitamin D Deficiency     No past surgical history on file.  Current Medications: Current Meds  Medication Sig  . Calcium Carbonate-Vitamin D (CALCIUM + D PO) Take by mouth daily.  . carvedilol (COREG) 6.25 MG tablet Take 6.25 mg by mouth 2 (two) times daily with a meal.  . Cholecalciferol (VITAMIN D) 2000 UNITS tablet Take 2,000 Units by mouth daily.  . fexofenadine (ALLEGRA) 60 MG tablet Take 60 mg by mouth 2 (two) times daily.  . fluticasone (  FLONASE) 50 MCG/ACT nasal spray Place 1 spray into both nostrils daily.  Marland Kitchen telmisartan (MICARDIS) 80 MG tablet Take 80 mg by mouth daily.  . [DISCONTINUED] diltiazem (CARDIZEM CD) 120 MG 24 hr capsule Take 1 capsule (120 mg total) by mouth daily.     Allergies:   Patient has no known allergies.   Social History   Socioeconomic History  . Marital status: Married    Spouse name: Not on file  . Number of children: Not on file  . Years of education: Not on file  . Highest education level: Not on file  Occupational History  . Not on file  Tobacco Use  . Smoking  status: Never Smoker  . Smokeless tobacco: Never Used  Substance and Sexual Activity  . Alcohol use: No  . Drug use: No  . Sexual activity: Not on file  Other Topics Concern  . Not on file  Social History Narrative  . Not on file   Social Determinants of Health   Financial Resource Strain:   . Difficulty of Paying Living Expenses: Not on file  Food Insecurity:   . Worried About Charity fundraiser in the Last Year: Not on file  . Ran Out of Food in the Last Year: Not on file  Transportation Needs:   . Lack of Transportation (Medical): Not on file  . Lack of Transportation (Non-Medical): Not on file  Physical Activity:   . Days of Exercise per Week: Not on file  . Minutes of Exercise per Session: Not on file  Stress:   . Feeling of Stress : Not on file  Social Connections:   . Frequency of Communication with Friends and Family: Not on file  . Frequency of Social Gatherings with Friends and Family: Not on file  . Attends Religious Services: Not on file  . Active Member of Clubs or Organizations: Not on file  . Attends Archivist Meetings: Not on file  . Marital Status: Not on file     Family History: The patient's family history includes Breast cancer in her mother; Heart attack in her son.  ROS:   Review of Systems  Constitution: Negative for decreased appetite, fever and weight gain.  HENT: Negative for congestion, ear discharge, hoarse voice and sore throat.   Eyes: Negative for discharge, redness, vision loss in right eye and visual halos.  Cardiovascular: Negative for chest pain, dyspnea on exertion, leg swelling, orthopnea and palpitations.  Respiratory: Negative for cough, hemoptysis, shortness of breath and snoring.   Endocrine: Negative for heat intolerance and polyphagia.  Hematologic/Lymphatic: Negative for bleeding problem. Does not bruise/bleed easily.  Skin: Negative for flushing, nail changes, rash and suspicious lesions.  Musculoskeletal:  Negative for arthritis, joint pain, muscle cramps, myalgias, neck pain and stiffness.  Gastrointestinal: Negative for abdominal pain, bowel incontinence, diarrhea and excessive appetite.  Genitourinary: Negative for decreased libido, genital sores and incomplete emptying.  Neurological: Negative for brief paralysis, focal weakness, headaches and loss of balance.  Psychiatric/Behavioral: Negative for altered mental status, depression and suicidal ideas.  Allergic/Immunologic: Negative for HIV exposure and persistent infections.    EKGs/Labs/Other Studies Reviewed:    The following studies were reviewed today:   EKG: None today  TTE 03/10/2019 IMPRESSIONS Transthoracic echocardiogram: 1. Left ventricular ejection fraction, by visual estimation, is 60 to 65%. The left ventricle has normal function. There is no left ventricular hypertrophy. 2. Left ventricular diastolic parameters are consistent with Grade I diastolic dysfunction (impaired relaxation). 3. The  mitral valve is normal in structure. Mild mitral valve regurgitation. No evidence of mitral stenosis. 4. The aortic valve is normal in structure. Aortic valve regurgitation is mild. No evidence of aortic valve sclerosis or stenosis.  FINDINGS Left Ventricle: Left ventricular ejection fraction, by visual estimation, is 60 to 65%. The left ventricle has normal function. There is no left ventricular hypertrophy. Left ventricular diastolic parameters are consistent with Grade I diastolic  dysfunction (impaired relaxation). Normal left atrial pressure.  Right Ventricle: The right ventricular size is normal. No increase in right ventricular wall thickness. Global RV systolic function is has normal systolic function. The tricuspid regurgitant velocity is 2.10 m/s, and with an assumed right atrial pressure of 3 mmHg, the estimated right ventricular systolic pressure is normal at 20.7 mmHg.  Left Atrium: Left atrial size was normal in  size.  Right Atrium: Right atrial size was normal in size  Pericardium: There is no evidence of pericardial effusion.  Mitral Valve: The mitral valve is normal in structure. No evidence of mitral valve stenosis by observation. Mild mitral valve regurgitation.  Tricuspid Valve: The tricuspid valve is normal in structure. Tricuspid valve regurgitation is not demonstrated.  Aortic Valve: The aortic valve is normal in structure. Aortic valve regurgitation is mild. Aortic regurgitation PHT measures 548 msec. The aortic valve is structurally normal, with no evidence of sclerosis or stenosis.  Pulmonic Valve: The pulmonic valve was normal in structure. Pulmonic valve regurgitation is not visualized.  Aorta: The aortic root, ascending aorta and aortic arch are all structurally normal, with no evidence of dilitation or obstruction.  Venous: The inferior vena cava is normal in size with greater than 50% respiratory variability, suggesting right atrial pressure of 3 mmHg.  IAS/Shunts: No atrial level shunt detected by color flow Doppler. No ventricular septal defect is seen or detected. There is no evidence of an atrial septal defect.  Zio monitor.  The patient wore the monitor for 7 days and 2 hours starting 03/03/2019. Indications: Palpitations The minimum heart rate was 38 bpm and the maximum heart rate was 179 bpm and the average heart rate was 67 bpm. The predominant underlying rhythm was Sinus Rhythm.  687 Supraventricular Tachycardia runs occurred, the run with the fastest interval lasting 7 beats with a max rate of 179 bpm, the longest lasting 16 beats with an average rate of 107 bpm.  Premature atrial complexes were frequent (7.8%,50633). Premature atrial Couplets were occasional (1.1%, 3556). Premature ventricular complexes were rare (<1.0%). No ventricular tachycardia, No pauses, No AV blocks and no atrial fibrillation.  No patient trigger events were noted.  Conclusion:  This study was remarkable for 687 supraventricular tachycardia which is likely atrial tachycardia. There was also frequent premature atrial complexes.  Recent Labs: 02/23/2019: ALT 14; BUN 14; Creatinine, Ser 0.64; Hemoglobin 13.3; Platelets 268; Potassium 4.2; Sodium 138  Recent Lipid Panel No results found for: CHOL, TRIG, HDL, CHOLHDL, VLDL, LDLCALC, LDLDIRECT  Physical Exam:    VS:  BP (!) 170/100 (BP Location: Right Arm, Patient Position: Sitting, Cuff Size: Normal)   Pulse 81   Ht 5\' 4"  (1.626 m)   Wt 110 lb (49.9 kg)   SpO2 99%   BMI 18.88 kg/m     Wt Readings from Last 3 Encounters:  05/26/19 110 lb (49.9 kg)  03/31/19 108 lb (49 kg)  03/03/19 109 lb (49.4 kg)     GEN: Well nourished, well developed in no acute distress HEENT: Normal NECK: No JVD; No carotid bruits  LYMPHATICS: No lymphadenopathy CARDIAC: S1S2 noted,RRR, no murmurs, rubs, gallops RESPIRATORY:  Clear to auscultation without rales, wheezing or rhonchi  ABDOMEN: Soft, non-tender, non-distended, +bowel sounds, no guarding. EXTREMITIES: No edema, No cyanosis, no clubbing MUSCULOSKELETAL:  No deformity  SKIN: Warm and dry NEUROLOGIC:  Alert and oriented x 3, non-focal PSYCHIATRIC:  Normal affect, good insight  ASSESSMENT:    1. Hypertensive heart disease without heart failure   2. PAT (paroxysmal atrial tachycardia) ([place])   3. PAC (premature atrial contraction)    PLAN:    Her blood pressure is high in the office today 170/100 mmHg manually but she tells me that she is usually in the 150s over 90s at home.  I am concerned about medication compliance in this patient.  As she has not been taking telmisartan as prescribed.  She admits to being compliant with the carvedilol 6.25 mg twice daily.  I had a long discussion with the patient during her visit.  I tried to explain to her the importance of taking both medications to keep her blood pressure under control which in her case will be less than 140s over  90s.  I also advised the patient of the detrimental effects of sustained/uncontrolled hypertension.  She expresses understanding and tells me that she is going to start her telmisartan daily and continue to take her carvedilol 6.25 mg twice daily.  I am holding off on adding another medication as I would like to see the effect of these 2 medications when she is compliant with them.  She also has been asked to take her blood pressure daily and bring this report at her next office visit.  Paroxysmal atrial tachycardia-continue beta-blocker  Frequent PACs-continue beta-blocker.  The patient is in agreement with the above plan. The patient left the office in stable condition.  The patient will follow up in 1 month or sooner if needed.   Medication Adjustments/Labs and Tests Ordered: Current medicines are reviewed at length with the patient today.  Concerns regarding medicines are outlined above.  No orders of the defined types were placed in this encounter.  No orders of the defined types were placed in this encounter.   Patient Instructions  Medication Instructions:  Your physician recommends that you continue on your current medications as directed. Please refer to the Current Medication list given to you today.  *If you need a refill on your cardiac medications before your next appointment, please call your pharmacy*  Lab Work: None  If you have labs (blood work) drawn today and your tests are completely normal, you will receive your results only by: Marland Kitchen MyChart Message (if you have MyChart) OR . A paper copy in the mail If you have any lab test that is abnormal or we need to change your treatment, we will call you to review the results.  Testing/Procedures: None  Follow-Up: At [facility], you and your health needs are our priority.  As part of our continuing mission to provide you with exceptional heart care, we have created designated Provider Care Teams.  These Care Teams  include your primary Cardiologist (physician) and Advanced Practice Providers (APPs -  Physician Assistants and Nurse Practitioners) who all work together to provide you with the care you need, when you need it.  Your next appointment:   1 month(s)  The format for your next appointment:   In Person  Provider:   Berniece Salines, DO  Other Instructions KEEP MARCH APPOINTMENT     Adopting a Healthy  Lifestyle.  Know what a healthy weight is for you (roughly BMI <25) and aim to maintain this   Aim for 7+ servings of fruits and vegetables daily   65-80+ fluid ounces of water or unsweet tea for healthy kidneys   Limit to max 1 drink of alcohol per day; avoid smoking/tobacco   Limit animal fats in diet for cholesterol and heart health - choose grass fed whenever available   Avoid highly processed foods, and foods high in saturated/trans fats   Aim for low stress - take time to unwind and care for your mental health   Aim for 150 min of moderate intensity exercise weekly for heart health, and weights twice weekly for bone health   Aim for 7-9 hours of sleep daily   When it comes to diets, agreement about the perfect plan isnt easy to find, even among the experts. Experts at the [place] developed an idea known as the Healthy Eating Plate. Just imagine a plate divided into logical, healthy portions.   The emphasis is on diet quality:   Load up on vegetables and fruits - one-half of your plate: Aim for color and variety, and remember that potatoes dont count.   Go for whole grains - one-quarter of your plate: Whole wheat, barley, wheat berries, quinoa, oats, brown rice, and foods made with them. If you want pasta, go with whole wheat pasta.   Protein power - one-quarter of your plate: Fish, chicken, beans, and nuts are all healthy, versatile protein sources. Limit red meat.   The diet, however, does go beyond the plate, offering a few other suggestions.    Use healthy plant oils, such as olive, canola, soy, corn, sunflower and peanut. Check the labels, and avoid partially hydrogenated oil, which have unhealthy trans fats.   If youre thirsty, drink water. Coffee and tea are good in moderation, but skip sugary drinks and limit milk and dairy products to one or two daily servings.   The type of carbohydrate in the diet is more important than the amount. Some sources of carbohydrates, such as vegetables, fruits, whole grains, and beans-are healthier than others.   Finally, stay active  Signed, Berniece Salines, DO  05/26/2019 11:33 AM    [place]

## 2019-05-26 NOTE — Patient Instructions (Signed)
Medication Instructions:  Your physician recommends that you continue on your current medications as directed. Please refer to the Current Medication list given to you today.  *If you need a refill on your cardiac medications before your next appointment, please call your pharmacy*  Lab Work: None  If you have labs (blood work) drawn today and your tests are completely normal, you will receive your results only by: Marland Kitchen MyChart Message (if you have MyChart) OR . A paper copy in the mail If you have any lab test that is abnormal or we need to change your treatment, we will call you to review the results.  Testing/Procedures: None  Follow-Up: At [facility], you and your health needs are our priority.  As part of our continuing mission to provide you with exceptional heart care, we have created designated Provider Care Teams.  These Care Teams include your primary Cardiologist (physician) and Advanced Practice Providers (APPs -  Physician Assistants and Nurse Practitioners) who all work together to provide you with the care you need, when you need it.  Your next appointment:   1 month(s)  The format for your next appointment:   In Person  Provider:   Berniece Salines, DO  Other Instructions [employer]

## 2019-06-05 ENCOUNTER — Ambulatory Visit: Payer: Medicare Other | Attending: Internal Medicine

## 2019-06-05 DIAGNOSIS — Z23 Encounter for immunization: Secondary | ICD-10-CM | POA: Insufficient documentation

## 2019-06-05 NOTE — Progress Notes (Signed)
   Covid-19 Vaccination Clinic  Name:  Kristina Bruce    MRN: [id number] DOB: Dec 11, 1930  06/05/2019  [name] was observed post Covid-19 immunization for 15 minutes without incidence. She was provided with Vaccine Information Sheet and instruction to access the V-Safe system.   [name] was instructed to call 911 with any severe reactions post vaccine: Marland Kitchen Difficulty breathing  . Swelling of your face and throat  . A fast heartbeat  . A bad rash all over your body  . Dizziness and weakness    Immunizations Administered    Name Date Dose VIS Date Route   Pfizer COVID-19 Vaccine 06/05/2019  9:33 AM 0.3 mL 04/03/2019 Intramuscular   Manufacturer: [place]   Lot: [id number]   [place]: [id number]

## 2019-06-25 ENCOUNTER — Ambulatory Visit (INDEPENDENT_AMBULATORY_CARE_PROVIDER_SITE_OTHER): Payer: Medicare Other | Admitting: Cardiology

## 2019-06-25 ENCOUNTER — Encounter: Payer: Self-pay | Admitting: Cardiology

## 2019-06-25 ENCOUNTER — Other Ambulatory Visit: Payer: Self-pay

## 2019-06-25 VITALS — BP 144/90 | HR 104 | Temp 97.3°F | Ht 64.5 in | Wt 108.0 lb

## 2019-06-25 DIAGNOSIS — I10 Essential (primary) hypertension: Secondary | ICD-10-CM | POA: Diagnosis not present

## 2019-06-25 DIAGNOSIS — I491 Atrial premature depolarization: Secondary | ICD-10-CM

## 2019-06-25 DIAGNOSIS — E782 Mixed hyperlipidemia: Secondary | ICD-10-CM | POA: Diagnosis not present

## 2019-06-25 MED ORDER — CARVEDILOL 12.5 MG PO TABS: 12.5000 mg | ORAL_TABLET | Freq: Two times a day (BID) | ORAL | 1 refills | Status: DC

## 2019-06-25 NOTE — Progress Notes (Signed)
Cardiology Office Note:    Date:  06/25/2019   ID:  Kristina Bruce, DOB 23-Aug-1930, MRN [id number]  PCP:  Glenford Bayley, DO  Cardiologist:  Berniece Salines, DO  Electrophysiologist:  None   Referring MD: Glenford Bayley, DO   Follow-up visit for coronary disease, hypertension  History of Present Illness:    Kristina Bruce is a 86 y.o. female with a hx of hypertension, hyperlipidemia who I initially saw on March 03, 2019 after patient had experienced a syncope episode.  At the conclusion of her initial visit an echocardiogram and a ZIO monitor was ordered.  In addition at her initial visit she was quite hypertensive and she admitted that she had not been taking her antihypertensive medications telmisartan which had been previously prescribed for her.  At that time I discussed with the patient strongly that it was possible for her to take her antihypertensive medicine so we can be able to keep her blood pressure close to 130/80 is much as possible.  On March 31, 2019 I saw the patient at a follow-up visit she did have evidence of paroxysmal atrial tachycardia about 687 episodes, with premature atrial complexes were frequent therefore started the patient on carvedilol 6.25 mg twice daily in an effort help with her elevated blood pressure as well as slow down her heart rate in the setting of those findings.  On December 31,2020 the patient called and reported that she was experiencing significant nausea, and felt that may have had a nosebleed episode from her carvedilol.  We therefore change her to Cardizem 120 mg daily.  And requested the patient be seen.  She follow-up on May 26, 2019 she did report that she had not been taking the Cardizem but switch back to the carvedilol 6.25 mg daily.  At that time she stated that the medication is working well for her.  I encouraged her to also start taking her Cardizem as she had not been taking this medication.  In the interim the patient has developed  macular degeneration and was seen at [facility] she is currently under treatment there.  She is here today for follow-up she tells me she has been feeling great while taking her medications.  She offers no other complaints at this time.  Past Medical History:  Diagnosis Date  . Allergic rhinitis   . Coronary atherosclerosis of native coronary artery   . Essential hypertension 03/03/2019  . Essential hypertension, benign   . Essential hypertension, benign   . Intermediate coronary syndrome ([place])   . Mixed hyperlipidemia 03/03/2019  . Osteopenia    with Vitamin D Deficiency   . PAC (premature atrial contraction) 03/03/2019  . Syncope and collapse 03/03/2019    History reviewed. No pertinent surgical history.  Current Medications: Current Meds  Medication Sig  . Calcium Carbonate-Vitamin D (CALCIUM + D PO) Take by mouth daily.  . Cholecalciferol (VITAMIN D) 2000 UNITS tablet Take 2,000 Units by mouth daily.  . ergocalciferol (VITAMIN D2) 1.25 MG (50000 UT) capsule Take by mouth.  . fexofenadine (ALLEGRA) 60 MG tablet Take 60 mg by mouth 2 (two) times daily.  . fluticasone (FLONASE) 50 MCG/ACT nasal spray Place 1 spray into both nostrils daily.  Marland Kitchen telmisartan (MICARDIS) 80 MG tablet Take 80 mg by mouth daily.  . [DISCONTINUED] carvedilol (COREG) 6.25 MG tablet Take 6.25 mg by mouth 2 (two) times daily with a meal.     Allergies:   Patient has no known allergies.  Social History   Socioeconomic History  . Marital status: Married    Spouse name: Not on file  . Number of children: Not on file  . Years of education: Not on file  . Highest education level: Not on file  Occupational History  . Not on file  Tobacco Use  . Smoking status: Never Smoker  . Smokeless tobacco: Never Used  Substance and Sexual Activity  . Alcohol use: No  . Drug use: No  . Sexual activity: Not on file  Other Topics Concern  . Not on file  Social History Narrative  . Not on file   Social  Determinants of Health   Financial Resource Strain:   . Difficulty of Paying Living Expenses: Not on file  Food Insecurity:   . Worried About Charity fundraiser in the Last Year: Not on file  . Ran Out of Food in the Last Year: Not on file  Transportation Needs:   . Lack of Transportation (Medical): Not on file  . Lack of Transportation (Non-Medical): Not on file  Physical Activity:   . Days of Exercise per Week: Not on file  . Minutes of Exercise per Session: Not on file  Stress:   . Feeling of Stress : Not on file  Social Connections:   . Frequency of Communication with Friends and Family: Not on file  . Frequency of Social Gatherings with Friends and Family: Not on file  . Attends Religious Services: Not on file  . Active Member of Clubs or Organizations: Not on file  . Attends Archivist Meetings: Not on file  . Marital Status: Not on file     Family History: The patient's family history includes Breast cancer in her mother; Heart attack in her son.  ROS:   Review of Systems  Constitution: Negative for decreased appetite, fever and weight gain.  HENT: Negative for congestion, ear discharge, hoarse voice and sore throat.   Eyes: Negative for discharge, redness, vision loss in right eye and visual halos.  Cardiovascular: Negative for chest pain, dyspnea on exertion, leg swelling, orthopnea and palpitations.  Respiratory: Negative for cough, hemoptysis, shortness of breath and snoring.   Endocrine: Negative for heat intolerance and polyphagia.  Hematologic/Lymphatic: Negative for bleeding problem. Does not bruise/bleed easily.  Skin: Negative for flushing, nail changes, rash and suspicious lesions.  Musculoskeletal: Negative for arthritis, joint pain, muscle cramps, myalgias, neck pain and stiffness.  Gastrointestinal: Negative for abdominal pain, bowel incontinence, diarrhea and excessive appetite.  Genitourinary: Negative for decreased libido, genital sores and  incomplete emptying.  Neurological: Negative for brief paralysis, focal weakness, headaches and loss of balance.  Psychiatric/Behavioral: Negative for altered mental status, depression and suicidal ideas.  Allergic/Immunologic: Negative for HIV exposure and persistent infections.    EKGs/Labs/Other Studies Reviewed:    The following studies were reviewed today:   EKG:  The ekg ordered today demonstrates   TTE 11/17/2020IMPRESSIONS Transthoracic echocardiogram: 1. Left ventricular ejection fraction, by visual estimation, is 60 to 65%. The left ventricle has normal function. There is no left ventricular hypertrophy. 2. Left ventricular diastolic parameters are consistent with Grade I diastolic dysfunction (impaired relaxation). 3. The mitral valve is normal in structure. Mild mitral valve regurgitation. No evidence of mitral stenosis. 4. The aortic valve is normal in structure. Aortic valve regurgitation is mild. No evidence of aortic valve sclerosis or stenosis.  FINDINGS Left Ventricle: Left ventricular ejection fraction, by visual estimation, is 60 to 65%. The left ventricle has  normal function. There is no left ventricular hypertrophy. Left ventricular diastolic parameters are consistent with Grade I diastolic  dysfunction (impaired relaxation). Normal left atrial pressure.  Right Ventricle: The right ventricular size is normal. No increase in right ventricular wall thickness. Global RV systolic function is has normal systolic function. The tricuspid regurgitant velocity is 2.10 m/s, and with an assumed right atrial pressure of 3 mmHg, the estimated right ventricular systolic pressure is normal at 20.7 mmHg.  Left Atrium: Left atrial size was normal in size.  Right Atrium: Right atrial size was normal in size  Pericardium: There is no evidence of pericardial effusion.  Mitral Valve: The mitral valve is normal in structure. No evidence of mitral valve stenosis by  observation. Mild mitral valve regurgitation.  Tricuspid Valve: The tricuspid valve is normal in structure. Tricuspid valve regurgitation is not demonstrated.  Aortic Valve: The aortic valve is normal in structure. Aortic valve regurgitation is mild. Aortic regurgitation PHT measures 548 msec. The aortic valve is structurally normal, with no evidence of sclerosis or stenosis.  Pulmonic Valve: The pulmonic valve was normal in structure. Pulmonic valve regurgitation is not visualized.  Aorta: The aortic root, ascending aorta and aortic arch are all structurally normal, with no evidence of dilitation or obstruction.  Venous: The inferior vena cava is normal in size with greater than 50% respiratory variability, suggesting right atrial pressure of 3 mmHg.  IAS/Shunts: No atrial level shunt detected by color flow Doppler. No ventricular septal defect is seen or detected. There is no evidence of an atrial septal defect.  Zio monitor. The patient wore the monitor for 7 days and 2 hours starting 03/03/2019. Indications: Palpitations The minimum heart rate was 38 bpm and the maximum heart rate was 179 bpm and the average heart rate was 67 bpm. The predominant underlying rhythm was Sinus Rhythm.  687 Supraventricular Tachycardia runs occurred, the run with the fastest interval lasting 7 beats with a max rate of 179 bpm, the longest lasting 16 beats with an average rate of 107 bpm.  Premature atrial complexes were frequent (7.8%,50633). Premature atrial Couplets were occasional (1.1%, 3556). Premature ventricular complexes were rare (<1.0%). No ventricular tachycardia, No pauses, No AV blocks and no atrial fibrillation.  No patient trigger events were noted.  Conclusion: This study was remarkable for 687 supraventricular tachycardia which is likely atrial tachycardia. There was also frequent premature atrial complexes.  Recent Labs: 02/23/2019: ALT 14; BUN 14; Creatinine, Ser 0.64;  Hemoglobin 13.3; Platelets 268; Potassium 4.2; Sodium 138  Recent Lipid Panel No results found for: CHOL, TRIG, HDL, CHOLHDL, VLDL, LDLCALC, LDLDIRECT  Physical Exam:    VS:  BP (!) 144/90   Pulse (!) 104   Temp (!) 97.3 F (36.3 C)   Ht 5' 4.5" (1.638 m)   Wt 108 lb (49 kg)   SpO2 99%   BMI 18.25 kg/m     Wt Readings from Last 3 Encounters:  06/25/19 108 lb (49 kg)  05/26/19 110 lb (49.9 kg)  03/31/19 108 lb (49 kg)     GEN: Well nourished, well developed in no acute distress HEENT: Normal NECK: No JVD; No carotid bruits LYMPHATICS: No lymphadenopathy CARDIAC: S1S2 noted,RRR, no murmurs, rubs, gallops RESPIRATORY:  Clear to auscultation without rales, wheezing or rhonchi  ABDOMEN: Soft, non-tender, non-distended, +bowel sounds, no guarding. EXTREMITIES: No edema, No cyanosis, no clubbing MUSCULOSKELETAL:  No deformity  SKIN: Warm and dry NEUROLOGIC:  Alert and oriented x 3, non-focal PSYCHIATRIC:  Normal affect,  good insight  ASSESSMENT:    1. Essential hypertension, benign   2. Mixed hyperlipidemia   3. PAC (premature atrial contraction)   4. Essential hypertension    PLAN:     1.  Hypertension-blood pressure slightly elevated in the office today but improved compared to prior visit she tells me she has been taking her medication as we discussed at her last visit.  With the Coreg and Micardis.  Milligrams increase the Coreg to 12.5 mg twice a day to also help with her heart rate.  2 paroxysmal atrial tachycardia-symptoms has improved but still intermittent palpitation.  Will increase Coreg to 12.5 mg twice daily  3.  PACs-we will increase to Coreg 12.5 mg  4.  Hyperlipidemia patient prefers diet control.  We will repeat her lipid profile at her next visit.  5.  Coronary disease noted in her chart-patient is not aware of how this diagnosis came about.  She currently is not on aspirin or statin.  She has no symptoms of angina at this time.  The patient is in  agreement with the above plan. The patient left the office in stable condition.  The patient will follow up in follow-up in 1 month.   Medication Adjustments/Labs and Tests Ordered: Current medicines are reviewed at length with the patient today.  Concerns regarding medicines are outlined above.  No orders of the defined types were placed in this encounter.  Meds ordered this encounter  Medications  . carvedilol (COREG) 12.5 MG tablet    Sig: Take 1 tablet (12.5 mg total) by mouth 2 (two) times daily.    Dispense:  180 tablet    Refill:  1    Patient Instructions  Medication Instructions:  Your physician has recommended you make the following change in your medication:   INCREASE: Coreg(carvedilol) to 12.5 mg Take 1 tab twice daily  *If you need a refill on your cardiac medications before your next appointment, please call your pharmacy*   Lab Work: None If you have labs (blood work) drawn today and your tests are completely normal, you will receive your results only by: Marland Kitchen MyChart Message (if you have MyChart) OR . A paper copy in the mail If you have any lab test that is abnormal or we need to change your treatment, we will call you to review the results.   Testing/Procedures: None   Follow-Up: At [facility], you and your health needs are our priority.  As part of our continuing mission to provide you with exceptional heart care, we have created designated Provider Care Teams.  These Care Teams include your primary Cardiologist (physician) and Advanced Practice Providers (APPs -  Physician Assistants and Nurse Practitioners) who all work together to provide you with the care you need, when you need it.  We recommend signing up for the patient portal called "MyChart".  Sign up information is provided on this After Visit Summary.  MyChart is used to connect with patients for Virtual Visits (Telemedicine).  Patients are able to view lab/test results, encounter notes, upcoming  appointments, etc.  Non-urgent messages can be sent to your provider as well.   To learn more about what you can do with MyChart, go to [email or web address].    Your next appointment:   1 month(s)  The format for your next appointment:   In Person  Provider:   Berniece Salines, DO   Other Instructions      Adopting a Healthy Lifestyle.  Know what a healthy weight  is for you (roughly BMI <25) and aim to maintain this   Aim for 7+ servings of fruits and vegetables daily   65-80+ fluid ounces of water or unsweet tea for healthy kidneys   Limit to max 1 drink of alcohol per day; avoid smoking/tobacco   Limit animal fats in diet for cholesterol and heart health - choose grass fed whenever available   Avoid highly processed foods, and foods high in saturated/trans fats   Aim for low stress - take time to unwind and care for your mental health   Aim for 150 min of moderate intensity exercise weekly for heart health, and weights twice weekly for bone health   Aim for 7-9 hours of sleep daily   When it comes to diets, agreement about the perfect plan isnt easy to find, even among the experts. Experts at the [employer] developed an idea known as the Healthy Eating Plate. Just imagine a plate divided into logical, healthy portions.   The emphasis is on diet quality:   Load up on vegetables and fruits - one-half of your plate: Aim for color and variety, and remember that potatoes dont count.   Go for whole grains - one-quarter of your plate: Whole wheat, barley, wheat berries, quinoa, oats, brown rice, and foods made with them. If you want pasta, go with whole wheat pasta.   Protein power - one-quarter of your plate: Fish, chicken, beans, and nuts are all healthy, versatile protein sources. Limit red meat.   The diet, however, does go beyond the plate, offering a few other suggestions.   Use healthy plant oils, such as olive, canola, soy, corn, sunflower  and peanut. Check the labels, and avoid partially hydrogenated oil, which have unhealthy trans fats.   If youre thirsty, drink water. Coffee and tea are good in moderation, but skip sugary drinks and limit milk and dairy products to one or two daily servings.   The type of carbohydrate in the diet is more important than the amount. Some sources of carbohydrates, such as vegetables, fruits, whole grains, and beans-are healthier than others.   Finally, stay active  Signed, Berniece Salines, DO  06/25/2019 10:18 AM    [place]

## 2019-06-25 NOTE — Patient Instructions (Signed)
Medication Instructions:  Your physician has recommended you make the following change in your medication:   INCREASE: Coreg(carvedilol) to 12.5 mg Take 1 tab twice daily  *If you need a refill on your cardiac medications before your next appointment, please call your pharmacy*   Lab Work: None If you have labs (blood work) drawn today and your tests are completely normal, you will receive your results only by: Marland Kitchen MyChart Message (if you have MyChart) OR . A paper copy in the mail If you have any lab test that is abnormal or we need to change your treatment, we will call you to review the results.   Testing/Procedures: None   Follow-Up: At [facility], you and your health needs are our priority.  As part of our continuing mission to provide you with exceptional heart care, we have created designated Provider Care Teams.  These Care Teams include your primary Cardiologist (physician) and Advanced Practice Providers (APPs -  Physician Assistants and Nurse Practitioners) who all work together to provide you with the care you need, when you need it.  We recommend signing up for the patient portal called "MyChart".  Sign up information is provided on this After Visit Summary.  MyChart is used to connect with patients for Virtual Visits (Telemedicine).  Patients are able to view lab/test results, encounter notes, upcoming appointments, etc.  Non-urgent messages can be sent to your provider as well.   To learn more about what you can do with MyChart, go to [email or web address].    Your next appointment:   1 month(s)  The format for your next appointment:   In Person  Provider:   Berniece Salines, DO   Other Instructions

## 2019-07-13 ENCOUNTER — Telehealth: Payer: Self-pay | Admitting: Cardiology

## 2019-07-13 MED ORDER — TELMISARTAN 40 MG PO TABS: 40.0000 mg | ORAL_TABLET | Freq: Every day | ORAL | 2 refills | Status: DC

## 2019-07-13 NOTE — Telephone Encounter (Signed)
Yes you are right-  From my perspective she should be on telmisartan 40 mg daily, the last time I saw her I increase the Coreg to 12.5 mg twice a day not the telmisartan..  Since I have  seen her I have never really changed her telmisartan she was on Coreg she stopped it, I started cardizem she did not like the medication therefore she decided that she wanted to go back on Coreg.   My advice would be to keep her on telmisartan 40 mg daily.

## 2019-07-13 NOTE — Telephone Encounter (Signed)
Pt c/o medication issue:  1. Name of Medication: telmisartan (MICARDIS) 80 MG tablet  2. How are you currently taking this medication (dosage and times per day)? Unsure.  3. Are you having a reaction (difficulty breathing--STAT)? no  4. What is your medication issue? Kristina Bruce from [facility] is calling to see who is going to be covering this medication. She states that her directions for this medication differ from ours. We have her as taking 80mg  one time daily and they have it as taking 40mg  2x daily. Please advise.

## 2019-07-13 NOTE — Telephone Encounter (Signed)
I spoke with Clarise Cruz and told her Dr Harriet Masson wanted the patient to take telmisartan 40 mg daily. Clarise Cruz will make patient aware.  Patient does not need refills at this time but I told Clarise Cruz our office could send in refills in the future. Med list updated. Patient is scheduled to see Dr Harriet Masson on April 2,2021.

## 2019-07-13 NOTE — Addendum Note (Signed)
Addended by: Thompson Grayer on: 07/13/2019 03:16 PM   Modules accepted: Orders

## 2019-07-13 NOTE — Telephone Encounter (Signed)
She is on olmesartan 40 mg daily.

## 2019-07-13 NOTE — Telephone Encounter (Signed)
Our records indicate patient is taking telmisartan 80 mg daily. We have not sent this prescription or any refills to patient's pharmacy. I spoke with Clarise Cruz at [facility]. Patient was seen in their office last Thursday due to elevated BP at the dentist's office on Wednesday. BP in the [facility] was 120/70 so no med changes were made.  Patient told provider she was taking telmisartan 40 mg twice daily and needed a refill. [facility].  Prior to this the last prescription sent to the pharmacy by [facility] was for 40 mg once daily in August 2019.  Patient told them she had not needed refill sooner due to having surplus supply at home. Clarise Cruz is calling to clarify what dose of telmisartan patient should be taking and which practice will be refilling this in the future.

## 2019-07-21 ENCOUNTER — Telehealth: Payer: Self-pay | Admitting: Cardiology

## 2019-07-21 NOTE — Telephone Encounter (Signed)
Pt is calling in stating she got the automated call back reminder for her upcoming appt with Dr Harriet Masson.  Pt is just wanting to confirm her visit is still an office visit, and not a virtual visit.  Informed the pt that in her appointment is noted "one month follow-up office visit."  Informed the pt that its not scheduled as a virtual visit. Pt verbalized understanding and gracious for all the assistance provided.

## 2019-07-21 NOTE — Telephone Encounter (Signed)
New message:    Patient calling stating some call her today to see if she wanted VV or OV, I did not see a note. Please call patient back.

## 2019-07-24 ENCOUNTER — Other Ambulatory Visit: Payer: Self-pay

## 2019-07-24 ENCOUNTER — Encounter: Payer: Self-pay | Admitting: Cardiology

## 2019-07-24 ENCOUNTER — Telehealth: Payer: Self-pay | Admitting: Cardiology

## 2019-07-24 ENCOUNTER — Ambulatory Visit (INDEPENDENT_AMBULATORY_CARE_PROVIDER_SITE_OTHER): Payer: Medicare Other | Admitting: Cardiology

## 2019-07-24 VITALS — BP 130/70 | HR 86 | Ht 64.5 in | Wt 107.8 lb

## 2019-07-24 DIAGNOSIS — I491 Atrial premature depolarization: Secondary | ICD-10-CM

## 2019-07-24 DIAGNOSIS — I10 Essential (primary) hypertension: Secondary | ICD-10-CM

## 2019-07-24 DIAGNOSIS — R002 Palpitations: Secondary | ICD-10-CM

## 2019-07-24 DIAGNOSIS — R079 Chest pain, unspecified: Secondary | ICD-10-CM

## 2019-07-24 MED ORDER — ACEBUTOLOL HCL 200 MG PO CAPS: 200.0000 mg | ORAL_CAPSULE | Freq: Two times a day (BID) | ORAL | 12 refills | Status: DC

## 2019-07-24 MED ORDER — ACEBUTOLOL HCL 200 MG PO CAPS: 200.0000 mg | ORAL_CAPSULE | Freq: Every day | ORAL | 12 refills | Status: DC

## 2019-07-24 MED ORDER — TELMISARTAN 40 MG PO TABS: 40.0000 mg | ORAL_TABLET | Freq: Two times a day (BID) | ORAL | 2 refills | Status: DC

## 2019-07-24 NOTE — Telephone Encounter (Signed)
New message   Patient's daughter has a question about the dosage on acebutolol (SECTRAL) 200 MG capsule. Please call to discuss.

## 2019-07-24 NOTE — Telephone Encounter (Signed)
Dosage clarified with daughter and med list updated.

## 2019-07-24 NOTE — Progress Notes (Signed)
Cardiology Office Note:    Date:  07/24/2019   ID:  Kristina Bruce, DOB 08-Jun-1930, MRN [id number]  PCP:  Kristina Bayley, DO  Cardiologist:  Kristina Salines, DO  Electrophysiologist:  None   Referring MD: Kristina Bayley, DO   Follow-up visit  History of Present Illness:    Kristina Bruce is a 86 y.o. female with a hx of hypertension, hyperlipidemia, recurrent PACs presents today for follow-up visit.  Saw the patient back in March 2021 at that time she was hypertensive with optimize antihypertensive medication by increasing her carvedilol to 12.5 mg daily.  In the interim she did see her PCP who increased her Micardis to 40 mg twice a day.  In the interim she was diagnosed with melanoma of her eye at [facility].  She is under treatment for this.  Today she tells me that her blood pressure has improved but she is experiencing worsening palpitations and now with new onset chest pain.  She described this chest pain as midsternal chest pressure which radiates to her left side.  She tells me this is a dull sensation.  It happens mostly on exertion and resolves with rest. She denies any associated shortness of breath but tells me that he is very concerned about these chest pain sensation.  Past Medical History:  Diagnosis Date  . Allergic rhinitis   . Coronary atherosclerosis of native coronary artery   . Essential hypertension 03/03/2019  . Essential hypertension, benign   . Essential hypertension, benign   . Intermediate coronary syndrome ([place])   . Mixed hyperlipidemia 03/03/2019  . Osteopenia    with Vitamin D Deficiency   . PAC (premature atrial contraction) 03/03/2019  . Syncope and collapse 03/03/2019    No past surgical history on file.  Current Medications: Current Meds  Medication Sig  . Calcium Carbonate-Vitamin D (CALCIUM + D PO) Take by mouth daily.  . ergocalciferol (VITAMIN D2) 1.25 MG (50000 UT) capsule Take by mouth once a week.   . fexofenadine (ALLEGRA) 60 MG  tablet Take 60 mg by mouth as needed.   . fluticasone (FLONASE) 50 MCG/ACT nasal spray Place 1 spray into both nostrils as needed.   Marland Kitchen telmisartan (MICARDIS) 40 MG tablet Take 1 tablet (40 mg total) by mouth 2 (two) times daily.  . [DISCONTINUED] carvedilol (COREG) 12.5 MG tablet Take 1 tablet (12.5 mg total) by mouth 2 (two) times daily.  . [DISCONTINUED] telmisartan (MICARDIS) 40 MG tablet Take 1 tablet (40 mg total) by mouth daily. (Patient taking differently: Take 80 mg by mouth daily. )  . [DISCONTINUED] telmisartan (MICARDIS) 40 MG tablet Take 80 mg by mouth daily.     Allergies:   Patient has no known allergies.   Social History   Socioeconomic History  . Marital status: Married    Spouse name: Not on file  . Number of children: Not on file  . Years of education: Not on file  . Highest education level: Not on file  Occupational History  . Not on file  Tobacco Use  . Smoking status: Never Smoker  . Smokeless tobacco: Never Used  Substance and Sexual Activity  . Alcohol use: No  . Drug use: No  . Sexual activity: Not on file  Other Topics Concern  . Not on file  Social History Narrative  . Not on file   Social Determinants of Health   Financial Resource Strain:   . Difficulty of Paying Living Expenses:  Food Insecurity:   . Worried About Charity fundraiser in the Last Year:   . Arboriculturist in the Last Year:   Transportation Needs:   . Film/video editor (Medical):   Marland Kitchen Lack of Transportation (Non-Medical):   Physical Activity:   . Days of Exercise per Week:   . Minutes of Exercise per Session:   Stress:   . Feeling of Stress :   Social Connections:   . Frequency of Communication with Friends and Family:   . Frequency of Social Gatherings with Friends and Family:   . Attends Religious Services:   . Active Member of Clubs or Organizations:   . Attends Archivist Meetings:   Marland Kitchen Marital Status:      Family History: The patient's family  history includes Breast cancer in her mother; Heart attack in her son.  ROS:   Review of Systems  Constitution: Negative for decreased appetite, fever and weight gain.  HENT: Negative for congestion, ear discharge, hoarse voice and sore throat.   Eyes: Negative for discharge, redness, vision loss in right eye and visual halos.  Cardiovascular: Reports chest pain and worsening palpitations.  Negative for, dyspnea on exertion, leg swelling, orthopnea.  Respiratory: Negative for cough, hemoptysis, shortness of breath and snoring.   Endocrine: Negative for heat intolerance and polyphagia.  Hematologic/Lymphatic: Negative for bleeding problem. Does not bruise/bleed easily.  Skin: Negative for flushing, nail changes, rash and suspicious lesions.  Musculoskeletal: Negative for arthritis, joint pain, muscle cramps, myalgias, neck pain and stiffness.  Gastrointestinal: Negative for abdominal pain, bowel incontinence, diarrhea and excessive appetite.  Genitourinary: Negative for decreased libido, genital sores and incomplete emptying.  Neurological: Negative for brief paralysis, focal weakness, headaches and loss of balance.  Psychiatric/Behavioral: Negative for altered mental status, depression and suicidal ideas.  Allergic/Immunologic: Negative for HIV exposure and persistent infections.    EKGs/Labs/Other Studies Reviewed:    The following studies were reviewed today:   EKG: None today  TTE Impression March 10, 2019 1. Left ventricular ejection fraction, by visual estimation, is 60 to  65%. The left ventricle has normal function. There is no left ventricular  hypertrophy.  2. Left ventricular diastolic parameters are consistent with Grade I  diastolic dysfunction (impaired relaxation).  3. The mitral valve is normal in structure. Mild mitral valve  regurgitation. No evidence of mitral stenosis.  4. The aortic valve is normal in structure. Aortic valve regurgitation is  mild. No  evidence of aortic valve sclerosis or stenosis.   Zio monitor  The patient wore the monitor for 7 days and 2 hours starting 03/03/2019. Indications: Palpitations The minimum heart rate was 38 bpm and the maximum heart rate was 179 bpm and the average heart rate was 67 bpm. The predominant underlying rhythm was Sinus Rhythm.  687 Supraventricular Tachycardia runs occurred, the run with the fastest interval lasting 7 beats with a max rate of 179 bpm, the longest lasting 16 beats with an average rate of 107 bpm.  Premature atrial complexes were  frequent (7.8%,50633). Premature atrial  Couplets were occasional (1.1%, 3556). Premature ventricular complexes were rare (<1.0%). No ventricular tachycardia, No pauses, No AV blocks and no atrial fibrillation.  No patient trigger events were noted.  Conclusion: This study was remarkable for 687 supraventricular tachycardia which is likely atrial tachycardia. There was also frequent premature atrial complexes  Recent Labs: 02/23/2019: ALT 14; BUN 14; Creatinine, Ser 0.64; Hemoglobin 13.3; Platelets 268; Potassium 4.2; Sodium 138  Recent Lipid Panel No results found for: CHOL, TRIG, HDL, CHOLHDL, VLDL, LDLCALC, LDLDIRECT  Physical Exam:    VS:  BP 130/70   Pulse 86   Ht 5' 4.5" (1.638 m)   Wt 107 lb 12.8 oz (48.9 kg)   SpO2 100%   BMI 18.22 kg/m     Wt Readings from Last 3 Encounters:  07/24/19 107 lb 12.8 oz (48.9 kg)  06/25/19 108 lb (49 kg)  05/26/19 110 lb (49.9 kg)     GEN: Well nourished, well developed in no acute distress HEENT: Normal NECK: No JVD; No carotid bruits LYMPHATICS: No lymphadenopathy CARDIAC: S1S2 noted,RRR, no murmurs, rubs, gallops RESPIRATORY:  Clear to auscultation without rales, wheezing or rhonchi  ABDOMEN: Soft, non-tender, non-distended, +bowel sounds, no guarding. EXTREMITIES: No edema, No cyanosis, no clubbing MUSCULOSKELETAL:  No deformity  SKIN: Warm and dry NEUROLOGIC:  Alert and oriented x 3,  non-focal PSYCHIATRIC:  Normal affect, good insight  ASSESSMENT:    1. Chest pain, unspecified type   2. Essential hypertension, benign   3. PAC (premature atrial contraction)   4. Palpitations    PLAN:    Her dull chest pain is concerning and she does have risk factor.  Therefore I like to pursue an ischemic evaluation in this patient.  We discussed a pharmacologic nuclear stress test.  Educated patient about this testing.  He is agreeable to proceed with this.  With her palpitation in the setting of her frequent PACs, she is getting no relief with the carvedilol therefore I am going to stop carvedilol and start the patient on acebutolol 200 mg daily for now- with plans to uptitrate as appropriate.  Her blood pressure susceptible in the office today.  Her PCP had increase her Micardis to 40 mg twice a day.  Unfortunately I am going to have to stop the carvedilol in the start of her acebutolol.  She will continue to monitor her blood pressure at home.  The patient is in agreement with the above plan. The patient left the office in stable condition.  The patient will follow up in 1 month or sooner if needed.   Medication Adjustments/Labs and Tests Ordered: Current medicines are reviewed at length with the patient today.  Concerns regarding medicines are outlined above.  Orders Placed This Encounter  Procedures  . Basic metabolic panel  . Magnesium  . TSH  . MYOCARDIAL PERFUSION IMAGING   Meds ordered this encounter  Medications  . telmisartan (MICARDIS) 40 MG tablet    Sig: Take 1 tablet (40 mg total) by mouth 2 (two) times daily.    Dispense:  180 tablet    Refill:  2  . DISCONTD: acebutolol (SECTRAL) 200 MG capsule    Sig: Take 1 capsule (200 mg total) by mouth 2 (two) times daily.    Dispense:  60 capsule    Refill:  12  . acebutolol (SECTRAL) 200 MG capsule    Sig: Take 1 capsule (200 mg total) by mouth 2 (two) times daily.    Dispense:  60 capsule    Refill:  12     Patient Instructions  Medication Instructions:  Your physician has recommended you make the following change in your medication:   Take Micardis 40 mg twice daily. Start Acebutolol 200 mg daily.  *If you need a refill on your cardiac medications before your next appointment, please call your pharmacy*   Lab Work: Your physician recommends that you have a BMET, Magnesium and TSH  done today.  If you have labs (blood work) drawn today and your tests are completely normal, you will receive your results only by: Marland Kitchen MyChart Message (if you have MyChart) OR . A paper copy in the mail If you have any lab test that is abnormal or we need to change your treatment, we will call you to review the results.   Testing/Procedures: Your physician has requested that you have a lexiscan myoview. For further information please visit [email or web address]. Please follow instruction sheet, as given.  The test will take approximately 3 to 4 hours to complete; you may bring reading material.  If someone comes with you to your appointment, they will need to remain in the main lobby due to limited space in the testing area. **If you are pregnant or breastfeeding, please notify the nuclear lab prior to your appointment**  How to prepare for your Myocardial Perfusion Test: . Do not eat or drink 3 hours prior to your test, except you may have water. . Do not consume products containing caffeine (regular or decaffeinated) 12 hours prior to your test. (ex: coffee, chocolate, sodas, tea). . Do bring a list of your current medications with you.  If not listed below, you may take your medications as normal. Do not take Acetubolol for 24 hours prior to the test.  Bring the medication to your appointment as you may be required to take it once the test is complete. . Do wear comfortable clothes (no dresses or overalls) and walking shoes, tennis shoes preferred (No heels or open toe shoes are allowed). . Do NOT wear  cologne, perfume, aftershave, or lotions (deodorant is allowed). . If these instructions are not followed, your test will have to be rescheduled.    Follow-Up: At [facility], you and your health needs are our priority.  As part of our continuing mission to provide you with exceptional heart care, we have created designated Provider Care Teams.  These Care Teams include your primary Cardiologist (physician) and Advanced Practice Providers (APPs -  Physician Assistants and Nurse Practitioners) who all work together to provide you with the care you need, when you need it.  We recommend signing up for the patient portal called "MyChart".  Sign up information is provided on this After Visit Summary.  MyChart is used to connect with patients for Virtual Visits (Telemedicine).  Patients are able to view lab/test results, encounter notes, upcoming appointments, etc.  Non-urgent messages can be sent to your provider as well.   To learn more about what you can do with MyChart, go to [email or web address].    Your next appointment:   1 month(s)  The format for your next appointment:   In Person  Provider:   Berniece Salines, DO   Other Instructions NA    Adopting a Healthy Lifestyle.  Know what a healthy weight is for you (roughly BMI <25) and aim to maintain this   Aim for 7+ servings of fruits and vegetables daily   65-80+ fluid ounces of water or unsweet tea for healthy kidneys   Limit to max 1 drink of alcohol per day; avoid smoking/tobacco   Limit animal fats in diet for cholesterol and heart health - choose grass fed whenever available   Avoid highly processed foods, and foods high in saturated/trans fats   Aim for low stress - take time to unwind and care for your mental health   Aim for 150 min of moderate intensity exercise weekly for heart health, and weights  twice weekly for bone health   Aim for 7-9 hours of sleep daily   When it comes to diets, agreement about the  perfect plan isnt easy to find, even among the experts. Experts at the [employer] [employer] developed an idea known as the Healthy Eating Plate. Just imagine a plate divided into logical, healthy portions.   The emphasis is on diet quality:   Load up on vegetables and fruits - one-half of your plate: Aim for color and variety, and remember that potatoes dont count.   Go for whole grains - one-quarter of your plate: Whole wheat, barley, wheat berries, quinoa, oats, brown rice, and foods made with them. If you want pasta, go with whole wheat pasta.   Protein power - one-quarter of your plate: Fish, chicken, beans, and nuts are all healthy, versatile protein sources. Limit red meat.   The diet, however, does go beyond the plate, offering a few other suggestions.   Use healthy plant oils, such as olive, canola, soy, corn, sunflower and peanut. Check the labels, and avoid partially hydrogenated oil, which have unhealthy trans fats.   If youre thirsty, drink water. Coffee and tea are good in moderation, but skip sugary drinks and limit milk and dairy products to one or two daily servings.   The type of carbohydrate in the diet is more important than the amount. Some sources of carbohydrates, such as vegetables, fruits, whole grains, and beans-are healthier than others.   Finally, stay active  Signed, Kristina Salines, DO  07/24/2019 12:22 PM    [facility]

## 2019-07-24 NOTE — Patient Instructions (Signed)
Medication Instructions:  Your physician has recommended you make the following change in your medication:   Take Micardis 40 mg twice daily. Start Acebutolol 200 mg daily.  *If you need a refill on your cardiac medications before your next appointment, please call your pharmacy*   Lab Work: Your physician recommends that you have a BMET, Magnesium and TSH done today.  If you have labs (blood work) drawn today and your tests are completely normal, you will receive your results only by: Marland Kitchen MyChart Message (if you have MyChart) OR . A paper copy in the mail If you have any lab test that is abnormal or we need to change your treatment, we will call you to review the results.   Testing/Procedures: Your physician has requested that you have a lexiscan myoview. For further information please visit [email or web address]. Please follow instruction sheet, as given.  The test will take approximately 3 to 4 hours to complete; you may bring reading material.  If someone comes with you to your appointment, they will need to remain in the main lobby due to limited space in the testing area. **If you are pregnant or breastfeeding, please notify the nuclear lab prior to your appointment**  How to prepare for your Myocardial Perfusion Test: . Do not eat or drink 3 hours prior to your test, except you may have water. . Do not consume products containing caffeine (regular or decaffeinated) 12 hours prior to your test. (ex: coffee, chocolate, sodas, tea). . Do bring a list of your current medications with you.  If not listed below, you may take your medications as normal. Do not take Acetubolol for 24 hours prior to the test.  Bring the medication to your appointment as you may be required to take it once the test is complete. . Do wear comfortable clothes (no dresses or overalls) and walking shoes, tennis shoes preferred (No heels or open toe shoes are allowed). . Do NOT wear cologne, perfume, aftershave, or  lotions (deodorant is allowed). . If these instructions are not followed, your test will have to be rescheduled.    Follow-Up: At [facility], you and your health needs are our priority.  As part of our continuing mission to provide you with exceptional heart care, we have created designated Provider Care Teams.  These Care Teams include your primary Cardiologist (physician) and Advanced Practice Providers (APPs -  Physician Assistants and Nurse Practitioners) who all work together to provide you with the care you need, when you need it.  We recommend signing up for the patient portal called "MyChart".  Sign up information is provided on this After Visit Summary.  MyChart is used to connect with patients for Virtual Visits (Telemedicine).  Patients are able to view lab/test results, encounter notes, upcoming appointments, etc.  Non-urgent messages can be sent to your provider as well.   To learn more about what you can do with MyChart, go to [email or web address].    Your next appointment:   1 month(s)  The format for your next appointment:   In Person  Provider:   Berniece Salines, DO   Other Instructions NA

## 2019-07-25 LAB — BASIC METABOLIC PANEL
BUN/Creatinine Ratio: 13 (ref 12–28)
BUN: 9 mg/dL (ref 8–27)
CO2: 25 mmol/L (ref 20–29)
Calcium: 9.5 mg/dL (ref 8.7–10.3)
Chloride: 97 mmol/L (ref 96–106)
Creatinine, Ser: 0.67 mg/dL (ref 0.57–1.00)
GFR calc Af Amer: 92 mL/min/{1.73_m2} (ref >59)
GFR calc non Af Amer: 80 mL/min/{1.73_m2} (ref >59)
Glucose: 94 mg/dL (ref 65–99)
Potassium: 4.4 mmol/L (ref 3.5–5.2)
Sodium: 134 mmol/L (ref 134–144)

## 2019-07-25 LAB — MAGNESIUM: Magnesium: 2.4 mg/dL — ABNORMAL HIGH (ref 1.6–2.3)

## 2019-07-25 LAB — TSH: TSH: 0.995 u[IU]/mL (ref 0.450–4.500)

## 2019-07-27 ENCOUNTER — Telehealth: Payer: Self-pay

## 2019-07-27 NOTE — Telephone Encounter (Signed)
Spoke with patient regarding results and recommendation.  Patient verbalizes understanding and is agreeable to plan of care. Advised patient to call back with any issues or concerns.  

## 2019-07-27 NOTE — Telephone Encounter (Signed)
-----   Message from Berniece Salines, DO sent at 07/25/2019  2:36 PM EDT ----- Magnesium slightly elevated otherwise normal labs.

## 2019-07-29 ENCOUNTER — Telehealth (HOSPITAL_COMMUNITY): Payer: Self-pay | Admitting: *Deleted

## 2019-07-29 NOTE — Telephone Encounter (Signed)
Close encounter 

## 2019-07-31 ENCOUNTER — Ambulatory Visit (HOSPITAL_COMMUNITY)
Admission: RE | Admit: 2019-07-31 | Discharge: 2019-07-31 | Disposition: A | Discharge status: Home / Self Care | Payer: Medicare Other | Source: Ambulatory Visit | Attending: Cardiovascular Disease | Admitting: Cardiovascular Disease

## 2019-07-31 ENCOUNTER — Ambulatory Visit (INDEPENDENT_AMBULATORY_CARE_PROVIDER_SITE_OTHER): Payer: Medicare Other | Admitting: Cardiovascular Disease

## 2019-07-31 ENCOUNTER — Encounter: Payer: Self-pay | Admitting: Cardiovascular Disease

## 2019-07-31 ENCOUNTER — Other Ambulatory Visit: Payer: Self-pay

## 2019-07-31 DIAGNOSIS — R079 Chest pain, unspecified: Secondary | ICD-10-CM | POA: Diagnosis not present

## 2019-07-31 DIAGNOSIS — I480 Paroxysmal atrial fibrillation: Secondary | ICD-10-CM | POA: Diagnosis not present

## 2019-07-31 HISTORY — DX: Paroxysmal atrial fibrillation: I48.0

## 2019-07-31 LAB — MYOCARDIAL PERFUSION IMAGING
Peak HR: 144 {beats}/min
Rest HR: 102 {beats}/min
SDS: 0
SRS: 3
SSS: 3
TID: 0.99

## 2019-07-31 MED ORDER — TECHNETIUM TC 99M TETROFOSMIN IV KIT
32.0000 | PACK | Freq: Once | INTRAVENOUS | Status: AC | PRN
  Administered 2019-07-31: 32 via INTRAVENOUS
  Filled 2019-07-31: qty 32

## 2019-07-31 MED ORDER — APIXABAN 2.5 MG PO TABS: 2.5000 mg | ORAL_TABLET | Freq: Two times a day (BID) | ORAL | 0 refills | Status: DC

## 2019-07-31 MED ORDER — AMINOPHYLLINE 25 MG/ML IV SOLN
75.0000 mg | Freq: Once | INTRAVENOUS | Status: AC
  Administered 2019-07-31: 75 mg via INTRAVENOUS

## 2019-07-31 MED ORDER — REGADENOSON 0.4 MG/5ML IV SOLN
0.4000 mg | Freq: Once | INTRAVENOUS | Status: AC
  Administered 2019-07-31: 0.4 mg via INTRAVENOUS

## 2019-07-31 MED ORDER — TECHNETIUM TC 99M TETROFOSMIN IV KIT
10.8000 | PACK | Freq: Once | INTRAVENOUS | Status: AC | PRN
  Administered 2019-07-31: 10.8 via INTRAVENOUS
  Filled 2019-07-31: qty 11

## 2019-07-31 NOTE — Patient Instructions (Addendum)
Medication Instructions:  INCREASE YOUR ACEBUTOLOL TO 200 MG 2 DAILY    START ELIQUIS 2.5 MG TWICE A DAY   *If you need a refill on your cardiac medications before your next appointment, please call your pharmacy*  Lab Work: NONE  If you have labs (blood work) drawn today and your tests are completely normal, you will receive your results only by: Marland Kitchen MyChart Message (if you have MyChart) OR . A paper copy in the mail If you have any lab test that is abnormal or we need to change your treatment, we will call you to review the results.  Testing/Procedures: NONE   Follow-Up: NEXT WEEK IN [place] [facility] 08/06/2019 at 2:55 pm with Dr Julianne Rice

## 2019-07-31 NOTE — Progress Notes (Signed)
Cardiology Office Note   Date:  07/31/2019   ID:  Maiesha, Tuckerman 1931/02/22, MRN [id number]  PCP:  Glenford Bayley, DO  Cardiologist:   Skeet Latch, MD   No chief complaint on file.     History of Present Illness: Kristina Bruce is a 86 y.o. female with hypertension, hyperlipidemia, and PACs who presents for new onset atrial fibrillation.  She saw Dr. Harriet Masson and reported palpitations and chest pain.  She was referred for an ambulatory monitor 02/2019 that revealed  frequent PACs.  There was no atrial fibrillation.  She was referred for [facility] and presented today, at which time she was noted to be in atrial fibrillation.  She has not been experiencing shortness of breath and denies lower extremity edema.  Overall she is feeling well.  She has been working with Dr. Jorene Minors on her hypertension regimen.  She was previously on carvedilol and this was switched to acebutalol.     Past Medical History:  Diagnosis Date  . Allergic rhinitis   . Coronary atherosclerosis of native coronary artery   . Essential hypertension 03/03/2019  . Essential hypertension, benign   . Essential hypertension, benign   . Intermediate coronary syndrome ([place])   . Mixed hyperlipidemia 03/03/2019  . Osteopenia    with Vitamin D Deficiency   . PAC (premature atrial contraction) 03/03/2019  . PAF (paroxysmal atrial fibrillation) ([place]) 07/31/2019  . Syncope and collapse 03/03/2019    No past surgical history on file.   Current Outpatient Medications  Medication Sig Dispense Refill  . acebutolol (SECTRAL) 200 MG capsule Take 200 mg by mouth as directed. TAKE 2 DAILY    . apixaban (ELIQUIS) 2.5 MG TABS tablet Take 1 tablet (2.5 mg total) by mouth 2 (two) times daily. 60 tablet 0  . Calcium Carbonate-Vitamin D (CALCIUM + D PO) Take by mouth daily.    . ergocalciferol (VITAMIN D2) 1.25 MG (50000 UT) capsule Take by mouth once a week.     . fexofenadine (ALLEGRA) 60 MG tablet Take 60 mg by mouth as  needed.     . fluticasone (FLONASE) 50 MCG/ACT nasal spray Place 1 spray into both nostrils as needed.     Marland Kitchen telmisartan (MICARDIS) 40 MG tablet Take 1 tablet (40 mg total) by mouth 2 (two) times daily. 180 tablet 2   No current facility-administered medications for this visit.    Allergies:   Patient has no known allergies.    Social History:  The patient  reports that she has never smoked. She has never used smokeless tobacco. She reports that she does not drink alcohol or use drugs.   Family History:  The patient's family history includes Breast cancer in her mother; Heart attack in her son.    ROS:  Please see the history of present illness.   Otherwise, review of systems are positive for none.   All other systems are reviewed and negative.    PHYSICAL EXAM: VS:  BP (!) 157/94   Pulse (!) 117  , BMI There is no height or weight on file to calculate BMI. GENERAL:  Well appearing HEENT:  Pupils equal round and reactive, fundi not visualized, oral mucosa unremarkable NECK:  No jugular venous distention, waveform within normal limits, carotid upstroke brisk and symmetric, no bruits LUNGS:  Clear to auscultation bilaterally HEART: Tachycardia.  Irregularly irregular.  PMI not displaced or sustained,S1 and S2 within normal limits, no S3, no S4, no clicks, no  rubs, no murmurs ABD:  Flat, positive bowel sounds normal in frequency in pitch, no bruits, no rebound, no guarding, no midline pulsatile mass, no hepatomegaly, no splenomegaly EXT:  2 plus pulses throughout, no edema, no cyanosis no clubbing SKIN:  No rashes no nodules NEURO:  Cranial nerves II through XII grossly intact, motor grossly intact throughout PSYCH:  Cognitively intact, oriented to person place and time   EKG:  EKG is ordered today. The ekg ordered today demonstrates atrial fibrillation.  [id number] bpm.  Recent Labs: 02/23/2019: ALT 14; Hemoglobin 13.3; Platelets 268 07/24/2019: BUN 9; Creatinine, Ser 0.67; Magnesium  2.4; Potassium 4.4; Sodium 134; TSH 0.995    Lipid Panel No results found for: CHOL, TRIG, HDL, CHOLHDL, VLDL, LDLCALC, LDLDIRECT    Wt Readings from Last 3 Encounters:  07/31/19 107 lb (48.5 kg)  07/24/19 107 lb 12.8 oz (48.9 kg)  06/25/19 108 lb (49 kg)      ASSESSMENT AND PLAN:  # Atrial fibrillation: New onset atrial fibrillation.  We will start her on Eliquis 2.5 mg twice daily.  Continue acebutolol as started by Dr. Harriet Masson recently.  She may need to switch to metoprolol or add diltiazem.  We will make sure that she is seen in clinic next week.   Current medicines are reviewed at length with the patient today.  The patient does not have concerns regarding medicines.  The following changes have been made: Add Eliquis  Labs/ tests ordered today include:  No orders of the defined types were placed in this encounter.    Disposition:   FU with Dr. Harriet Masson next week.     Signed, Kristina Wiemann C. Oval Linsey, MD, [facility]  07/31/2019 5:52 PM    [place]

## 2019-08-02 ENCOUNTER — Emergency Department (HOSPITAL_BASED_OUTPATIENT_CLINIC_OR_DEPARTMENT_OTHER): Payer: Medicare Other

## 2019-08-02 ENCOUNTER — Emergency Department (HOSPITAL_BASED_OUTPATIENT_CLINIC_OR_DEPARTMENT_OTHER)
Admission: EM | Admit: 2019-08-02 | Discharge: 2019-08-03 | Disposition: A | Discharge status: Home / Self Care | Payer: Medicare Other | Attending: Emergency Medicine | Admitting: Emergency Medicine

## 2019-08-02 ENCOUNTER — Encounter (HOSPITAL_BASED_OUTPATIENT_CLINIC_OR_DEPARTMENT_OTHER): Payer: Self-pay | Admitting: *Deleted

## 2019-08-02 ENCOUNTER — Other Ambulatory Visit: Payer: Self-pay

## 2019-08-02 DIAGNOSIS — Z7901 Long term (current) use of anticoagulants: Secondary | ICD-10-CM | POA: Insufficient documentation

## 2019-08-02 DIAGNOSIS — I4891 Unspecified atrial fibrillation: Secondary | ICD-10-CM | POA: Insufficient documentation

## 2019-08-02 DIAGNOSIS — I10 Essential (primary) hypertension: Secondary | ICD-10-CM | POA: Insufficient documentation

## 2019-08-02 DIAGNOSIS — R519 Headache, unspecified: Secondary | ICD-10-CM | POA: Diagnosis not present

## 2019-08-02 DIAGNOSIS — I2510 Atherosclerotic heart disease of native coronary artery without angina pectoris: Secondary | ICD-10-CM | POA: Diagnosis not present

## 2019-08-02 DIAGNOSIS — R0789 Other chest pain: Secondary | ICD-10-CM | POA: Diagnosis present

## 2019-08-02 DIAGNOSIS — Z79899 Other long term (current) drug therapy: Secondary | ICD-10-CM | POA: Insufficient documentation

## 2019-08-02 LAB — URINALYSIS, MICROSCOPIC (REFLEX): RBC / HPF: NONE SEEN RBC/hpf (ref 0–5)

## 2019-08-02 LAB — URINALYSIS, ROUTINE W REFLEX MICROSCOPIC
Bilirubin Urine: NEGATIVE
Glucose, UA: NEGATIVE mg/dL
Hgb urine dipstick: NEGATIVE
Ketones, ur: NEGATIVE mg/dL
Nitrite: NEGATIVE
Protein, ur: NEGATIVE mg/dL
Specific Gravity, Urine: 1.01 (ref 1.005–1.030)
pH: 6 (ref 5.0–8.0)

## 2019-08-02 LAB — CBC WITH DIFFERENTIAL/PLATELET
Abs Immature Granulocytes: 0.06 10*3/uL (ref 0.00–0.07)
Basophils Absolute: 0 10*3/uL (ref 0.0–0.1)
Basophils Relative: 0 %
Eosinophils Absolute: 0.1 10*3/uL (ref 0.0–0.5)
Eosinophils Relative: 1 %
HCT: 42.5 % (ref 36.0–46.0)
Hemoglobin: 13.7 g/dL (ref 12.0–15.0)
Immature Granulocytes: 1 %
Lymphocytes Relative: 16 %
Lymphs Abs: 2.1 10*3/uL (ref 0.7–4.0)
MCH: 28.9 pg (ref 26.0–34.0)
MCHC: 32.2 g/dL (ref 30.0–36.0)
MCV: 89.7 fL (ref 80.0–100.0)
Monocytes Absolute: 1.1 10*3/uL — ABNORMAL HIGH (ref 0.1–1.0)
Monocytes Relative: 8 %
Neutro Abs: 9.6 10*3/uL — ABNORMAL HIGH (ref 1.7–7.7)
Neutrophils Relative %: 74 %
Platelets: 281 10*3/uL (ref 150–400)
RBC: 4.74 MIL/uL (ref 3.87–5.11)
RDW: 14.3 % (ref 11.5–15.5)
WBC: 13 10*3/uL — ABNORMAL HIGH (ref 4.0–10.5)
nRBC: 0 % (ref 0.0–0.2)

## 2019-08-02 LAB — BASIC METABOLIC PANEL
Anion gap: 9 (ref 5–15)
BUN: 14 mg/dL (ref 8–23)
CO2: 24 mmol/L (ref 22–32)
Calcium: 9.3 mg/dL (ref 8.9–10.3)
Chloride: 98 mmol/L (ref 98–111)
Creatinine, Ser: 0.72 mg/dL (ref 0.44–1.00)
GFR calc Af Amer: >60 mL/min (ref >60)
GFR calc non Af Amer: >60 mL/min (ref >60)
Glucose, Bld: 118 mg/dL — ABNORMAL HIGH (ref 70–99)
Potassium: 4.3 mmol/L (ref 3.5–5.1)
Sodium: 131 mmol/L — ABNORMAL LOW (ref 135–145)

## 2019-08-02 LAB — TROPONIN I (HIGH SENSITIVITY): Troponin I (High Sensitivity): 11 ng/L (ref <18)

## 2019-08-02 MED ORDER — DILTIAZEM HCL 25 MG/5ML IV SOLN
10.0000 mg | Freq: Once | INTRAVENOUS | Status: AC
  Administered 2019-08-02: 10 mg via INTRAVENOUS
  Filled 2019-08-02: qty 5

## 2019-08-02 MED ORDER — SODIUM CHLORIDE 0.9 % IV BOLUS
500.0000 mL | Freq: Once | INTRAVENOUS | Status: AC
  Administered 2019-08-02: 22:00:00 500 mL via INTRAVENOUS

## 2019-08-02 MED ORDER — DILTIAZEM HCL ER COATED BEADS 120 MG PO CP24: 120.0000 mg | ORAL_CAPSULE | Freq: Every day | ORAL | 0 refills | Status: DC

## 2019-08-02 NOTE — ED Triage Notes (Addendum)
Pt had a stress test on Friday and was dx with afib. She has been started on eliquis. Reports she has been having chest pain and SOB over the last few weeks and has been evaluated by her cardiologist. They also increased her BP meds. She is here today because her BP has been high and she has a headache and low back pain

## 2019-08-02 NOTE — ED Notes (Signed)
Pt to CT

## 2019-08-02 NOTE — ED Notes (Signed)
ED Provider at bedside. 

## 2019-08-02 NOTE — ED Provider Notes (Signed)
[facility] EMERGENCY DEPARTMENT Provider Note  CSN: [id number] Arrival date & time: 08/02/19 1805    History Chief Complaint  Patient presents with  . Hypertension    HPI  Kristina Bruce is a 86 y.o. female presents for evaluation with her daughter. The patient has been having episodes of chest pain and SOB. Was evaluated by cardiology and sent for a stress test last week that was neg for ischemic changes, but she was noted to be in Afib that day. She has also had problems with poorly controlled HTN recently and had acebutolol added to her Micardis. Eliquis was also added after Afib diagnosis. Today she has had a moderate aching frontal headache all day long as well as moderate aching low back pain. She has not had chest pain today. Denies any vomiting but has had some nausea. No diarrhea, no hematuria or dysuria. She has not had her evening doses of her medications yet.    Past Medical History:  Diagnosis Date  . Allergic rhinitis   . Coronary atherosclerosis of native coronary artery   . Essential hypertension 03/03/2019  . Essential hypertension, benign   . Essential hypertension, benign   . Intermediate coronary syndrome ([place])   . Mixed hyperlipidemia 03/03/2019  . Osteopenia    with Vitamin D Deficiency   . PAC (premature atrial contraction) 03/03/2019  . PAF (paroxysmal atrial fibrillation) ([place]) 07/31/2019  . Syncope and collapse 03/03/2019    History reviewed. No pertinent surgical history.  Family History  Problem Relation Age of Onset  . Breast cancer Mother   . Heart attack Son        stents    Social History   Tobacco Use  . Smoking status: Never Smoker  . Smokeless tobacco: Never Used  Substance Use Topics  . Alcohol use: No  . Drug use: No     Home Medications Prior to Admission medications   Medication Sig Start Date End Date Taking? Authorizing Provider  acebutolol (SECTRAL) 200 MG capsule Take 200 mg by mouth as directed. TAKE 2  DAILY    [provider]  apixaban (ELIQUIS) 2.5 MG TABS tablet Take 1 tablet (2.5 mg total) by mouth 2 (two) times daily. 07/31/19   Skeet Latch, MD  Calcium Carbonate-Vitamin D (CALCIUM + D PO) Take by mouth daily.    [provider]  diltiazem (CARDIZEM CD) 120 MG 24 hr capsule Take 1 capsule (120 mg total) by mouth daily. 08/02/19   Truddie Hidden, MD  ergocalciferol (VITAMIN D2) 1.25 MG (50000 UT) capsule Take by mouth once a week.     [provider]  fexofenadine (ALLEGRA) 60 MG tablet Take 60 mg by mouth as needed.     [provider]  fluticasone (FLONASE) 50 MCG/ACT nasal spray Place 1 spray into both nostrils as needed.     [provider]  telmisartan (MICARDIS) 40 MG tablet Take 1 tablet (40 mg total) by mouth 2 (two) times daily. 07/24/19   Tobb, Godfrey Pick, DO     Allergies    Patient has no known allergies.   Review of Systems   Review of Systems  Constitutional: Negative for fever.  HENT: Negative for congestion and sore throat.   Respiratory: Negative for cough and shortness of breath.   Cardiovascular: Negative for chest pain.  Gastrointestinal: Positive for nausea. Negative for abdominal pain, diarrhea and vomiting.  Genitourinary: Negative for dysuria.  Musculoskeletal: Positive for back pain. Negative for myalgias.  Skin: Negative for rash.  Neurological: Positive for headaches.  Psychiatric/Behavioral: Negative for behavioral problems.     Physical Exam BP (!) 143/108 (BP Location: Left Arm)   Pulse (!) 106   Temp 98.3 F (36.8 C) (Oral)   Resp 18   Ht 5\' 4"  (1.626 m)   Wt 48.5 kg   SpO2 98%   BMI 18.35 kg/m   Physical Exam Constitutional:      Appearance: Normal appearance.  HENT:     Head: Normocephalic and atraumatic.     Nose: Nose normal.     Mouth/Throat:     Mouth: Mucous membranes are moist.  Eyes:     Extraocular Movements: Extraocular movements intact.     Conjunctiva/sclera:  Conjunctivae normal.  Cardiovascular:     Rate and Rhythm: Tachycardia present. Rhythm irregular.  Pulmonary:     Effort: Pulmonary effort is normal.     Breath sounds: Normal breath sounds.  Abdominal:     General: Abdomen is flat.     Palpations: Abdomen is soft.     Tenderness: There is no abdominal tenderness.  Musculoskeletal:        General: No swelling. Normal range of motion.     Cervical back: Neck supple.  Skin:    General: Skin is warm and dry.  Neurological:     General: No focal deficit present.     Mental Status: She is alert.  Psychiatric:        Mood and Affect: Mood normal.      ED Results / Procedures / Treatments   Labs (all labs ordered are listed, but only abnormal results are displayed) Labs Reviewed  BASIC METABOLIC PANEL - Abnormal; Notable for the following components:      Result Value   Sodium 131 (*)    Glucose, Bld 118 (*)    All other components within normal limits  CBC WITH DIFFERENTIAL/PLATELET - Abnormal; Notable for the following components:   WBC 13.0 (*)    Neutro Abs 9.6 (*)    Monocytes Absolute 1.1 (*)    All other components within normal limits  URINALYSIS, ROUTINE W REFLEX MICROSCOPIC - Abnormal; Notable for the following components:   Leukocytes,Ua TRACE (*)    All other components within normal limits  URINALYSIS, MICROSCOPIC (REFLEX) - Abnormal; Notable for the following components:   Bacteria, UA FEW (*)    All other components within normal limits  TROPONIN I (HIGH SENSITIVITY)    EKG None   Radiology CT Head Wo Contrast  Result Date: 08/02/2019 CLINICAL DATA:  86 year old female with acute headache, recently started Eliquis. EXAM: CT HEAD WITHOUT CONTRAST TECHNIQUE: Contiguous axial images were obtained from the base of the skull through the vertex without intravenous contrast. COMPARISON:  Head CT 03/13/2019. FINDINGS: Brain: Stable cerebral volume. No midline shift, mass effect, or evidence of intracranial mass  lesion. No ventriculomegaly. No acute intracranial hemorrhage identified. Stable gray-white matter differentiation throughout the brain. Patchy bilateral white matter hypodensity. No cortically based acute infarct identified. Vascular: Mild Calcified atherosclerosis at the skull base.6 No suspicious intracranial vascular hyperdensity. Skull: Osteopenia.  No acute osseous abnormality identified. Sinuses/Orbits: Visualized paranasal sinuses and mastoids are clear. Other: Stable orbit and scalp soft tissues. IMPRESSION: No acute intracranial abnormality. Cerebral white matter changes appears stable since November. Electronically Signed   By: Genevie Ann M.D.   On: 08/02/2019 21:52    Procedures Procedures  Medications Ordered in the ED Medications  sodium chloride 0.9 % bolus  500 mL (0 mLs Intravenous Stopped 08/02/19 2314)  diltiazem (CARDIZEM) injection 10 mg (10 mg Intravenous Given 08/02/19 2215)     ED Course  I have reviewed the triage vital signs and the nursing notes.  Pertinent labs & imaging results that were available during my care of the patient were reviewed by me and considered in my medical decision making (see chart for details).  Clinical Course as of Aug 02 936  Sun Aug 02, 2019  2022 Patient here with several complaints and concerns of hypertension. She has a benign exam, initial HR was 106, however on EKG noted to be 133 and in afib. Will check her labs, head CT and give her evening meds.   [CS]  2222 Patient's labs and head CT are unremarkable. HR has remained in 130-140s despite taking her home Beta blocker. I discussed the case with Dr. Emilio Aspen, on call for Cardiology who does not recommend cardioversion. He recommends a dose of Cardizem, IVF bolus and if rate is better controlled, d/c with oral Cardizem XR 120mg  daily. If rate not controlled, will need Cardizem drip and admission. Patient and daughter at bedside aware of the plan.    [CS]  2236 EKG did not cross into MUSE @  2001: Rate 131Rhythm: Afib RSR'No ST elevationNo old for comparison   [CS]  2309 HR is well controlled. BP improved. Patient is feeling well and ready go to home with Rx for Cardizem. Already has Cards followup this week.    [CS]    Clinical Course User Index [CS] Truddie Hidden, MD    MDM Rules/Calculators/A&P MDM  Final Clinical Impression(s) / ED Diagnoses Final diagnoses:  Atrial fibrillation with rapid ventricular response [facility])    Rx / DC Orders ED Discharge Orders         Ordered    diltiazem (CARDIZEM CD) 120 MG 24 hr capsule  Daily     08/02/19 2311           Truddie Hidden, MD 08/03/19 [phone]

## 2019-08-02 NOTE — ED Notes (Signed)
Pt took home medications as directed by EDP.

## 2019-08-03 ENCOUNTER — Other Ambulatory Visit: Payer: Self-pay

## 2019-08-03 ENCOUNTER — Telehealth: Payer: Self-pay | Admitting: Cardiology

## 2019-08-03 MED ORDER — APIXABAN 2.5 MG PO TABS: 2.5000 mg | ORAL_TABLET | Freq: Two times a day (BID) | ORAL | 1 refills | Status: DC

## 2019-08-03 NOTE — Telephone Encounter (Signed)
Spoke with patient regarding time change for appointment 08/06/19 with Dr. Romelle Starcher is 3:35pm not 2:45pm.  Patient voiced her understanding.

## 2019-08-04 ENCOUNTER — Other Ambulatory Visit: Payer: Self-pay

## 2019-08-04 ENCOUNTER — Encounter (HOSPITAL_BASED_OUTPATIENT_CLINIC_OR_DEPARTMENT_OTHER): Payer: Self-pay

## 2019-08-04 ENCOUNTER — Telehealth: Payer: Self-pay

## 2019-08-04 ENCOUNTER — Emergency Department (HOSPITAL_BASED_OUTPATIENT_CLINIC_OR_DEPARTMENT_OTHER)
Admission: EM | Admit: 2019-08-04 | Discharge: 2019-08-05 | Disposition: A | Discharge status: Home / Self Care | Payer: Medicare Other | Attending: Emergency Medicine | Admitting: Emergency Medicine

## 2019-08-04 DIAGNOSIS — I2510 Atherosclerotic heart disease of native coronary artery without angina pectoris: Secondary | ICD-10-CM | POA: Diagnosis not present

## 2019-08-04 DIAGNOSIS — G8918 Other acute postprocedural pain: Secondary | ICD-10-CM | POA: Insufficient documentation

## 2019-08-04 DIAGNOSIS — I4891 Unspecified atrial fibrillation: Secondary | ICD-10-CM | POA: Insufficient documentation

## 2019-08-04 DIAGNOSIS — I10 Essential (primary) hypertension: Secondary | ICD-10-CM | POA: Insufficient documentation

## 2019-08-04 DIAGNOSIS — Z7901 Long term (current) use of anticoagulants: Secondary | ICD-10-CM | POA: Insufficient documentation

## 2019-08-04 DIAGNOSIS — Z79899 Other long term (current) drug therapy: Secondary | ICD-10-CM | POA: Insufficient documentation

## 2019-08-04 NOTE — ED Triage Notes (Signed)
Pt states she had surgery to right eye today r/t to a melanoma-c/o increase to pain to right eye and elevated BP-NAD-steady gait

## 2019-08-04 NOTE — Telephone Encounter (Signed)
Spoke with patient - she picked up a 30 day supply of Eliquis at local pharmacy, then received another 30 day supply from mail order.  Was concerned as to whether she would be on med for that long.  Explained that she would most likely be on for a minimum for 4-6 months, but could be life-long.    Patient has appointment with Dr. Geraldo Pitter later this week, explained that he will be able to give her a better answer.

## 2019-08-04 NOTE — Telephone Encounter (Signed)
44f 48.9kg Scr 0.72 08/02/19 [facility] 07/31/19

## 2019-08-04 NOTE — Telephone Encounter (Signed)
Patient called expressing concern because she got put a new rx of Eliquis and it was sent and filled by 2 different pharmacies.  Pt does not want to be on medication that long but does not want to pay for both if she will not be on it longer than 30 days.  Patient would like a call back about what the possibility is of her not being on Eliquis for more than 30 days.

## 2019-08-05 ENCOUNTER — Encounter (HOSPITAL_BASED_OUTPATIENT_CLINIC_OR_DEPARTMENT_OTHER): Payer: Self-pay | Admitting: Emergency Medicine

## 2019-08-05 ENCOUNTER — Telehealth: Payer: Self-pay | Admitting: Cardiology

## 2019-08-05 ENCOUNTER — Emergency Department (HOSPITAL_BASED_OUTPATIENT_CLINIC_OR_DEPARTMENT_OTHER): Payer: Medicare Other

## 2019-08-05 ENCOUNTER — Other Ambulatory Visit: Payer: Self-pay

## 2019-08-05 DIAGNOSIS — G8918 Other acute postprocedural pain: Secondary | ICD-10-CM | POA: Diagnosis not present

## 2019-08-05 LAB — CBC WITH DIFFERENTIAL/PLATELET
Abs Immature Granulocytes: 0.04 10*3/uL (ref 0.00–0.07)
Basophils Absolute: 0 10*3/uL (ref 0.0–0.1)
Basophils Relative: 0 %
Eosinophils Absolute: 0.1 10*3/uL (ref 0.0–0.5)
Eosinophils Relative: 1 %
HCT: 43.2 % (ref 36.0–46.0)
Hemoglobin: 13.8 g/dL (ref 12.0–15.0)
Immature Granulocytes: 0 %
Lymphocytes Relative: 18 %
Lymphs Abs: 2.2 10*3/uL (ref 0.7–4.0)
MCH: 28.9 pg (ref 26.0–34.0)
MCHC: 31.9 g/dL (ref 30.0–36.0)
MCV: 90.6 fL (ref 80.0–100.0)
Monocytes Absolute: 0.9 10*3/uL (ref 0.1–1.0)
Monocytes Relative: 8 %
Neutro Abs: 9 10*3/uL — ABNORMAL HIGH (ref 1.7–7.7)
Neutrophils Relative %: 73 %
Platelets: 284 10*3/uL (ref 150–400)
RBC: 4.77 MIL/uL (ref 3.87–5.11)
RDW: 14.4 % (ref 11.5–15.5)
WBC: 12.3 10*3/uL — ABNORMAL HIGH (ref 4.0–10.5)
nRBC: 0 % (ref 0.0–0.2)

## 2019-08-05 LAB — BASIC METABOLIC PANEL
Anion gap: 11 (ref 5–15)
BUN: 15 mg/dL (ref 8–23)
CO2: 23 mmol/L (ref 22–32)
Calcium: 9.4 mg/dL (ref 8.9–10.3)
Chloride: 100 mmol/L (ref 98–111)
Creatinine, Ser: 0.66 mg/dL (ref 0.44–1.00)
GFR calc Af Amer: >60 mL/min (ref >60)
GFR calc non Af Amer: >60 mL/min (ref >60)
Glucose, Bld: 127 mg/dL — ABNORMAL HIGH (ref 70–99)
Potassium: 3.9 mmol/L (ref 3.5–5.1)
Sodium: 134 mmol/L — ABNORMAL LOW (ref 135–145)

## 2019-08-05 LAB — TROPONIN I (HIGH SENSITIVITY): Troponin I (High Sensitivity): 8 ng/L (ref <18)

## 2019-08-05 MED ORDER — MOXIFLOXACIN HCL 0.5 % OP SOLN: 1.0000 [drp] | Freq: Three times a day (TID) | OPHTHALMIC | 0 refills | Status: DC

## 2019-08-05 MED ORDER — ACETAMINOPHEN 500 MG PO TABS
1000.0000 mg | ORAL_TABLET | Freq: Once | ORAL | Status: AC
  Administered 2019-08-05: 1000 mg via ORAL
  Filled 2019-08-05: qty 2

## 2019-08-05 MED ORDER — DILTIAZEM HCL 25 MG/5ML IV SOLN
10.0000 mg | Freq: Once | INTRAVENOUS | Status: AC
  Administered 2019-08-05: 10 mg via INTRAVENOUS
  Filled 2019-08-05: qty 5

## 2019-08-05 NOTE — Telephone Encounter (Signed)
  Patient's daughter is calling because the patient usually sees Dr Harriet Masson but is coming to see Dr Geraldo Pitter tomorrow. She wants to make sure that Dr Geraldo Pitter would be aware of the patient's last two hospital stays. She also would like for Dr Geraldo Pitter to talk to her mother about being on some anxiety meds. Please call to discuss.

## 2019-08-05 NOTE — Telephone Encounter (Signed)
Contacted patient daughter- advised that we would send this information over- all ER visits are in chart.   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 I advised I would route a message and call her back as soon as I knew something.

## 2019-08-05 NOTE — ED Provider Notes (Signed)
[facility] Provider Note   CSN: [id number] Arrival date & time: 08/04/19  2114     History Chief Complaint  Patient presents with  . Post-op Problem    Kristina Bruce is a 86 y.o. female.  The history is provided by the patient.  Illness Location:  Right eye Quality:  Post injection pain and now BP is up secondary to pain so she came in for both Severity:  Mild Onset quality:  Gradual Duration:  8 hours Timing:  Constant Progression:  Unchanged Chronicity:  New Context:  Injection for melanoma of the right eye earlier in the day.   Relieved by:  Nothing Worsened by:  Nothing Ineffective treatments:  None tried Associated symptoms: no abdominal pain, no chest pain, no congestion, no cough, no diarrhea, no ear pain, no fatigue, no fever, no headaches, no loss of consciousness, no myalgias, no nausea, no rash, no rhinorrhea, no shortness of breath, no sore throat, no vomiting and no wheezing   Risk factors:  Atrial fibrillation       Past Medical History:  Diagnosis Date  . Allergic rhinitis   . Coronary atherosclerosis of native coronary artery   . Essential hypertension 03/03/2019  . Essential hypertension, benign   . Essential hypertension, benign   . Intermediate coronary syndrome ([place])   . Mixed hyperlipidemia 03/03/2019  . Osteopenia    with Vitamin D Deficiency   . PAC (premature atrial contraction) 03/03/2019  . PAF (paroxysmal atrial fibrillation) ([place]) 07/31/2019  . Syncope and collapse 03/03/2019    Patient Active Problem List   Diagnosis Date Noted  . PAF (paroxysmal atrial fibrillation) ([place]) 07/31/2019  . Chest pain 07/24/2019  . Syncope and collapse 03/03/2019  . Essential hypertension 03/03/2019  . Mixed hyperlipidemia 03/03/2019  . PAC (premature atrial contraction) 03/03/2019  . Coronary atherosclerosis of native coronary artery   . Intermediate coronary syndrome ([place])   . Essential hypertension, benign      History reviewed. No pertinent surgical history.   OB History    Gravida  2   Para  2   Term      Preterm      AB      Living  2     SAB      TAB      Ectopic      Multiple      Live Births              Family History  Problem Relation Age of Onset  . Breast cancer Mother   . Heart attack Son        stents    Social History   Tobacco Use  . Smoking status: Never Smoker  . Smokeless tobacco: Never Used  Substance Use Topics  . Alcohol use: No  . Drug use: No    Home Medications Prior to Admission medications   Medication Sig Start Date End Date Taking? Authorizing Provider  acebutolol (SECTRAL) 200 MG capsule Take 200 mg by mouth as directed. TAKE 2 DAILY    [provider]  apixaban (ELIQUIS) 2.5 MG TABS tablet Take 1 tablet (2.5 mg total) by mouth 2 (two) times daily. 08/03/19   Skeet Latch, MD  Calcium Carbonate-Vitamin D (CALCIUM + D PO) Take by mouth daily.    [provider]  diltiazem (CARDIZEM CD) 120 MG 24 hr capsule Take 1 capsule (120 mg total) by mouth daily. 08/02/19   Truddie Hidden, MD  ergocalciferol (VITAMIN D2) 1.25 MG (50000 UT) capsule Take by mouth once a week.     [provider]  fexofenadine (ALLEGRA) 60 MG tablet Take 60 mg by mouth as needed.     [provider]  fluticasone (FLONASE) 50 MCG/ACT nasal spray Place 1 spray into both nostrils as needed.     [provider]  moxifloxacin (VIGAMOX) 0.5 % ophthalmic solution Place 1 drop into the right eye 3 (three) times daily. 08/05/19   Lemma Tetro, MD  telmisartan (MICARDIS) 40 MG tablet Take 1 tablet (40 mg total) by mouth 2 (two) times daily. 07/24/19   Tobb, Godfrey Pick, DO    Allergies    Patient has no known allergies.  Review of Systems   Review of Systems  Constitutional: Negative for fatigue and fever.  HENT: Negative for congestion, ear pain, rhinorrhea and sore throat.   Eyes: Positive for pain. Negative for  visual disturbance.  Respiratory: Negative for cough, shortness of breath and wheezing.   Cardiovascular: Negative for chest pain and leg swelling.  Gastrointestinal: Negative for abdominal pain, diarrhea, nausea and vomiting.  Genitourinary: Negative for difficulty urinating.  Musculoskeletal: Negative for myalgias.  Skin: Negative for rash.  Neurological: Negative for seizures, loss of consciousness, weakness, numbness and headaches.  Psychiatric/Behavioral: Negative for agitation.  All other systems reviewed and are negative.   Physical Exam Updated Vital Signs BP 140/87   Pulse 80   Temp 98 F (36.7 C) (Oral)   Resp 18   SpO2 96%   Physical Exam Vitals and nursing note reviewed.  Constitutional:      General: She is not in acute distress.    Appearance: Normal appearance.  HENT:     Head: Normocephalic and atraumatic.     Nose: Nose normal.     Mouth/Throat:     Mouth: Mucous membranes are moist.     Pharynx: Oropharynx is clear.  Eyes:     Extraocular Movements: Extraocular movements intact.     Pupils: Pupils are equal, round, and reactive to light.     Comments: Eye pressure 18 mild injection of the right sclera   Cardiovascular:     Rate and Rhythm: Tachycardia present. Rhythm irregular.     Pulses: Normal pulses.     Heart sounds: Normal heart sounds.  Pulmonary:     Effort: Pulmonary effort is normal.     Breath sounds: Normal breath sounds.  Abdominal:     General: Abdomen is flat. Bowel sounds are normal.     Tenderness: There is no abdominal tenderness. There is no guarding or rebound.  Musculoskeletal:        General: Normal range of motion.     Cervical back: Normal range of motion and neck supple.  Skin:    General: Skin is warm and dry.     Capillary Refill: Capillary refill takes less than 2 seconds.  Neurological:     General: No focal deficit present.     Mental Status: She is alert and oriented to person, place, and time.     Deep Tendon  Reflexes: Reflexes normal.  Psychiatric:        Mood and Affect: Mood normal.        Behavior: Behavior normal.     ED Results / Procedures / Treatments   Labs (all labs ordered are listed, but only abnormal results are displayed) Labs Reviewed  CBC WITH DIFFERENTIAL/PLATELET - Abnormal; Notable for the following components:  Result Value   WBC 12.3 (*)    Neutro Abs 9.0 (*)    All other components within normal limits  BASIC METABOLIC PANEL - Abnormal; Notable for the following components:   Sodium 134 (*)    Glucose, Bld 127 (*)    All other components within normal limits  TROPONIN I (HIGH SENSITIVITY)    EKG AFIB at 84 see muse  Radiology DG Chest Portable 1 View  Result Date: 08/05/2019 CLINICAL DATA:  Atrial fibrillation, hypertension, recent right ocular surgery for melanoma EXAM: PORTABLE CHEST 1 VIEW COMPARISON:  02/23/2019 FINDINGS: Single frontal view of the chest demonstrates an enlarged cardiac silhouette. Emphysema again noted without airspace disease, effusion, or pneumothorax. No acute bony abnormality. IMPRESSION: 1. Enlarged cardiac silhouette. 2. Stable emphysema.  No acute airspace disease. Electronically Signed   By: Randa Ngo M.D.   On: 08/05/2019 00:49    Procedures Procedures (including critical care time)  Medications Ordered in ED Medications  acetaminophen (TYLENOL) tablet 1,000 mg (1,000 mg Oral Given 08/05/19 0030)  diltiazem (CARDIZEM) injection 10 mg (10 mg Intravenous Given 08/05/19 0031)    ED Course  I have reviewed the triage vital signs and the nursing notes.  Pertinent labs & imaging results that were available during my care of the patient were reviewed by me and considered in my medical decision making (see chart for details).    Case d/w Dr. Eulas Post on call for patient's eye surgeon.  If pressure is normal plase start antibiotic drops and follow up today at the office.    Patient found to be in afib with rvr on exam.  Is on  blood thinners,  Given diltiazem with normalization of rate.  Instructed to follow up with both cardiology and her eye surgeon.  Patient verbalizes understanding and agrees to follow up.   Kristina Bruce was evaluated in Emergency Department on 08/05/2019 for the symptoms described in the history of present illness. She was evaluated in the context of the global COVID-19 pandemic, which necessitated consideration that the patient might be at risk for infection with the SARS-CoV-2 virus that causes COVID-19. Institutional protocols and algorithms that pertain to the evaluation of patients at risk for COVID-19 are in a state of rapid change based on information released by regulatory bodies including the CDC and federal and state organizations. These policies and algorithms were followed during the patient's care in the ED.  Final Clinical Impression(s) / ED Diagnoses Final diagnoses:  Post-operative pain  Atrial fibrillation, unspecified type ([place])   Return for weakness, numbness, changes in vision or speech, fevers >100.4 unrelieved by medication, shortness of breath, intractable vomiting, or diarrhea, abdominal pain, Inability to tolerate liquids or food, cough, altered mental status or any concerns. No signs of systemic illness or infection. The patient is nontoxic-appearing on exam and vital signs are within normal limits.   I have reviewed the triage vital signs and the nursing notes. Pertinent labs &imaging results that were available during my care of the patient were reviewed by me and considered in my medical decision making (see chart for details).  After history, exam, and medical workup I feel the patient has been appropriately medically screened and is safe for discharge home. Pertinent diagnoses were discussed with the patient. Patient was givenstrictreturn precautions. Rx / DC Orders ED Discharge Orders         Ordered    moxifloxacin (VIGAMOX) 0.5 % ophthalmic solution  3  times daily  08/05/19 0130           Melisia Leming, MD 08/05/19 [id number]

## 2019-08-05 NOTE — Telephone Encounter (Signed)
[place] for mom to join

## 2019-08-05 NOTE — ED Notes (Signed)
Called [facility] [phone]

## 2019-08-05 NOTE — Telephone Encounter (Signed)
Contacted daughter and advised it was okay for her to come to this appointment. Daughter verbalized understanding, thankful for call.

## 2019-08-06 ENCOUNTER — Other Ambulatory Visit: Payer: Self-pay

## 2019-08-06 ENCOUNTER — Ambulatory Visit (INDEPENDENT_AMBULATORY_CARE_PROVIDER_SITE_OTHER): Payer: Medicare Other | Admitting: Cardiology

## 2019-08-06 ENCOUNTER — Encounter: Payer: Self-pay | Admitting: Cardiology

## 2019-08-06 VITALS — BP 170/100 | HR 84 | Ht 64.0 in | Wt 106.0 lb

## 2019-08-06 DIAGNOSIS — I491 Atrial premature depolarization: Secondary | ICD-10-CM

## 2019-08-06 DIAGNOSIS — R0600 Dyspnea, unspecified: Secondary | ICD-10-CM

## 2019-08-06 DIAGNOSIS — I480 Paroxysmal atrial fibrillation: Secondary | ICD-10-CM

## 2019-08-06 DIAGNOSIS — I10 Essential (primary) hypertension: Secondary | ICD-10-CM | POA: Diagnosis not present

## 2019-08-06 MED ORDER — ALPRAZOLAM 0.25 MG PO TABS: 0.1250 mg | ORAL_TABLET | Freq: Every evening | ORAL | 0 refills | Status: DC | PRN

## 2019-08-06 MED ORDER — AMIODARONE HCL 400 MG PO TABS: 400.0000 mg | ORAL_TABLET | Freq: Every day | ORAL | 6 refills | Status: DC

## 2019-08-06 MED ORDER — METOPROLOL SUCCINATE ER 50 MG PO TB24: 25.0000 mg | ORAL_TABLET | Freq: Every day | ORAL | 3 refills | Status: DC

## 2019-08-06 NOTE — Patient Instructions (Addendum)
Medication Instructions:  Your physician has recommended you make the following change in your medication:   Stop taking Acebutolol and Diltiazem. Start Toprol XL 50 mg take 1/2 tablet (25 mg) daily. Start Amiodarone 400 mg daily. Take Xanax 0.125 mg daily as needed for anxiety.   *If you need a refill on your cardiac medications before your next appointment, please call your pharmacy*   Lab Work: None ordered If you have labs (blood work) drawn today and your tests are completely normal, you will receive your results only by: Marland Kitchen MyChart Message (if you have MyChart) OR . A paper copy in the mail If you have any lab test that is abnormal or we need to change your treatment, we will call you to review the results.   Testing/Procedures: Your physician has recommended that you have a pulmonary function test. Pulmonary Function Tests are a group of tests that measure how well air moves in and out of your lungs.    Follow-Up: At [facility], you and your health needs are our priority.  As part of our continuing mission to provide you with exceptional heart care, we have created designated Provider Care Teams.  These Care Teams include your primary Cardiologist (physician) and Advanced Practice Providers (APPs -  Physician Assistants and Nurse Practitioners) who all work together to provide you with the care you need, when you need it.  We recommend signing up for the patient portal called "MyChart".  Sign up information is provided on this After Visit Summary.  MyChart is used to connect with patients for Virtual Visits (Telemedicine).  Patients are able to view lab/test results, encounter notes, upcoming appointments, etc.  Non-urgent messages can be sent to your provider as well.   To learn more about what you can do with MyChart, go to [email or web address].    Your next appointment:   2 weeks The format for your next appointment:   In Person  Provider:   Jyl Heinz,  MD   Other Instructions NA

## 2019-08-06 NOTE — Progress Notes (Signed)
Cardiology Office Note:    Date:  08/06/2019   ID:  Kristina Bruce, DOB 09-04-1930, MRN [id number]  PCP:  System, Pcp Not In  Cardiologist:  Jenean Lindau, MD   Referring MD: Glenford Bayley, DO    ASSESSMENT:    1. Essential hypertension   2. PAF (paroxysmal atrial fibrillation) ([place])   3. PAC (premature atrial contraction)    PLAN:    In order of problems listed above:  1. Paroxysmal atrial fibrillation: This has been a quality of life issue for this lady.  She is on anticoagulation.  In view of this following recommendations were made: I stopped her beta-blocker and calcium channel blocker at this time.  I have initiated on amiodarone 400 mg daily.  Titrate her dose after I see her in follow-up in about 10 days.  I also initiated on Toprol-XL 50 mg half tablet daily.  She will keep a track of her pulse and blood pressure and send me the results in the next few days.  Patient had a chest x-ray recently and I reviewed it.  This was done yesterday.  She will have pulmonary function tests.  She will have Chem-7 and liver panel today. 2. Essential hypertension: Blood pressure is elevated.  She has an element of whitecoat hypertension plus she is very significantly stressed about all these events.  I gave her a prescription of Xanax 0.125 mg to be used on a as needed basis.  15 tablets were given with no refills and precaution was explained. 3. She will be seen in follow-up appointment in 8 to 10 days or earlier if she has any concerns.  Subsequently once this issue stabilizes I will have her follow my partner Dr. Harriet Masson.  Patient and daughter had multiple questions which were answered to their satisfaction.   Medication Adjustments/Labs and Tests Ordered: Current medicines are reviewed at length with the patient today.  Concerns regarding medicines are outlined above.  No orders of the defined types were placed in this encounter.  No orders of the defined types were placed in this  encounter.    Chief Complaint  Patient presents with  . Follow-up     History of Present Illness:    Kristina Bruce is a 86 y.o. female.  Patient has been recently diagnosed to have paroxysmal atrial fibrillation.  There is history of coronary atherosclerosis.  She has history of essential hypertension.  She is very anxious lady.  She has had couple of episodes of paroxysmal atrial fibrillation.  She is very concerned about this.  She lives by herself and her daughter mentions to me that this has taken a toll on her emotional being.  No orthopnea or PND.  She has been initiated on beta-blocker and calcium channel blocker and apparently it has not helped her much.  Past Medical History:  Diagnosis Date  . Allergic rhinitis   . Coronary atherosclerosis of native coronary artery   . Essential hypertension 03/03/2019  . Essential hypertension, benign   . Essential hypertension, benign   . Intermediate coronary syndrome ([place])   . Mixed hyperlipidemia 03/03/2019  . Osteopenia    with Vitamin D Deficiency   . PAC (premature atrial contraction) 03/03/2019  . PAF (paroxysmal atrial fibrillation) ([place]) 07/31/2019  . Syncope and collapse 03/03/2019    History reviewed. No pertinent surgical history.  Current Medications: Current Meds  Medication Sig  . acebutolol (SECTRAL) 200 MG capsule Take 200 mg by mouth as directed. TAKE  2 DAILY  . apixaban (ELIQUIS) 2.5 MG TABS tablet Take 1 tablet (2.5 mg total) by mouth 2 (two) times daily.  . Calcium Carbonate-Vitamin D (CALCIUM + D PO) Take by mouth daily.  Marland Kitchen diltiazem (CARDIZEM CD) 120 MG 24 hr capsule Take 1 capsule (120 mg total) by mouth daily.  . ergocalciferol (VITAMIN D2) 1.25 MG (50000 UT) capsule Take by mouth once a week.   . fexofenadine (ALLEGRA) 60 MG tablet Take 60 mg by mouth as needed.   . fluticasone (FLONASE) 50 MCG/ACT nasal spray Place 1 spray into both nostrils as needed.   Marland Kitchen telmisartan (MICARDIS) 40 MG tablet Take 1  tablet (40 mg total) by mouth 2 (two) times daily.     Allergies:   Patient has no known allergies.   Social History   Socioeconomic History  . Marital status: Married    Spouse name: Not on file  . Number of children: Not on file  . Years of education: Not on file  . Highest education level: Not on file  Occupational History  . Not on file  Tobacco Use  . Smoking status: Never Smoker  . Smokeless tobacco: Never Used  Substance and Sexual Activity  . Alcohol use: No  . Drug use: No  . Sexual activity: Not on file  Other Topics Concern  . Not on file  Social History Narrative  . Not on file   Social Determinants of Health   Financial Resource Strain:   . Difficulty of Paying Living Expenses:   Food Insecurity:   . Worried About Charity fundraiser in the Last Year:   . Arboriculturist in the Last Year:   Transportation Needs:   . Film/video editor (Medical):   Marland Kitchen Lack of Transportation (Non-Medical):   Physical Activity:   . Days of Exercise per Week:   . Minutes of Exercise per Session:   Stress:   . Feeling of Stress :   Social Connections:   . Frequency of Communication with Friends and Family:   . Frequency of Social Gatherings with Friends and Family:   . Attends Religious Services:   . Active Member of Clubs or Organizations:   . Attends Archivist Meetings:   Marland Kitchen Marital Status:      Family History: The patient's family history includes Breast cancer in her mother; Heart attack in her son.  ROS:   Please see the history of present illness.    All other systems reviewed and are negative.  EKGs/Labs/Other Studies Reviewed:    The following studies were reviewed today: I reviewed records extensively including emergency room visit.  She has been to the emergency room on 2 occasions.   Recent Labs: 02/23/2019: ALT 14 07/24/2019: Magnesium 2.4; TSH 0.995 08/05/2019: BUN 15; Creatinine, Ser 0.66; Hemoglobin 13.8; Platelets 284; Potassium 3.9;  Sodium 134  Recent Lipid Panel No results found for: CHOL, TRIG, HDL, CHOLHDL, VLDL, LDLCALC, LDLDIRECT  Physical Exam:    VS:  BP (!) 170/100   Pulse 84   Ht 5\' 4"  (1.626 m)   Wt 106 lb (48.1 kg)   SpO2 98%   BMI 18.19 kg/m     Wt Readings from Last 3 Encounters:  08/06/19 106 lb (48.1 kg)  08/02/19 106 lb 14.8 oz (48.5 kg)  07/31/19 107 lb (48.5 kg)     GEN: Patient is in no acute distress HEENT: Normal NECK: No JVD; No carotid bruits LYMPHATICS: No lymphadenopathy CARDIAC: Hear  sounds regular, 2/6 systolic murmur at the apex. RESPIRATORY:  Clear to auscultation without rales, wheezing or rhonchi  ABDOMEN: Soft, non-tender, non-distended MUSCULOSKELETAL:  No edema; No deformity  SKIN: Warm and dry NEUROLOGIC:  Alert and oriented x 3 PSYCHIATRIC:  Normal affect   Signed, Jenean Lindau, MD  08/06/2019 4:10 PM    [facility]

## 2019-08-07 ENCOUNTER — Other Ambulatory Visit: Payer: Self-pay | Admitting: *Deleted

## 2019-08-07 ENCOUNTER — Other Ambulatory Visit: Payer: Self-pay | Admitting: Cardiology

## 2019-08-07 LAB — BASIC METABOLIC PANEL
BUN/Creatinine Ratio: 18 (ref 12–28)
BUN: 14 mg/dL (ref 8–27)
CO2: 22 mmol/L (ref 20–29)
Calcium: 9.4 mg/dL (ref 8.7–10.3)
Chloride: 100 mmol/L (ref 96–106)
Creatinine, Ser: 0.78 mg/dL (ref 0.57–1.00)
GFR calc Af Amer: 80 mL/min/{1.73_m2} (ref >59)
GFR calc non Af Amer: 69 mL/min/{1.73_m2} (ref >59)
Glucose: 98 mg/dL (ref 65–99)
Potassium: 4.3 mmol/L (ref 3.5–5.2)
Sodium: 135 mmol/L (ref 134–144)

## 2019-08-07 LAB — HEPATIC FUNCTION PANEL
ALT: 47 IU/L — ABNORMAL HIGH (ref 0–32)
AST: 40 IU/L (ref 0–40)
Albumin: 4.3 g/dL (ref 3.6–4.6)
Alkaline Phosphatase: 105 IU/L (ref 39–117)
Bilirubin Total: 0.6 mg/dL (ref 0.0–1.2)
Bilirubin, Direct: 0.2 mg/dL (ref 0.00–0.40)
Total Protein: 6.5 g/dL (ref 6.0–8.5)

## 2019-08-07 MED ORDER — METOPROLOL SUCCINATE ER 50 MG PO TB24: 25.0000 mg | ORAL_TABLET | Freq: Every day | ORAL | 3 refills | Status: DC

## 2019-08-07 MED ORDER — AMIODARONE HCL 400 MG PO TABS: 400.0000 mg | ORAL_TABLET | Freq: Every day | ORAL | 1 refills | Status: DC

## 2019-08-07 NOTE — Telephone Encounter (Signed)
*  STAT* If patient is at the pharmacy, call can be transferred to refill team.   1. Which medications need to be refilled? (please list name of each medication and dose if known) Toprol XL 50 mg, amiodarone 400 mg  2. Which pharmacy/location (including street and city if local pharmacy) medication to be sent to? Please send to [facility] in [place]  3. Do they need a 30 day or 90 day supply? [facility] and [facility] was to [facility] to [facility]. Please do not send to [facility]. Patient cancel the one for [facility] because insurance will not pay for it if she need to go to [facility]

## 2019-08-07 NOTE — Telephone Encounter (Signed)
Prescription sent to local pharmacy per patient request. A Rx for Metoprolol and Amiodarone was sent until mail order supply arrives.

## 2019-08-07 NOTE — Telephone Encounter (Signed)
Prescriptions has been sent local pharmacy, please see other encounter for this date.

## 2019-08-10 ENCOUNTER — Telehealth: Payer: Self-pay | Admitting: Cardiology

## 2019-08-10 NOTE — Telephone Encounter (Signed)
  Pt c/o medication issue:  1. Name of Medication: amiodarone (PACERONE) 400 MG tablet  2. How are you currently taking this medication (dosage and times per day)? As written  3. Are you having a reaction (difficulty breathing--STAT)?   4. What is your medication issue? Pt's daughter called, she said when they picked up this prescription she noticed on the instruction sheet it says, " this drug always started in the hospital" she is concern because Dr. Geraldo Pitter prescribed this and did not start in the hospital. She would like to get some clarification. According to her pt started taking this med on Saturday morning and the pt have some nausea and some nights pt have like a panic attack, she has palpitations. She also said her and her brother alternates sleeping at pt house to look after her. She said she will be busy between 9:45 to 11 am today. After that she is available to receive a call.  Please call

## 2019-08-10 NOTE — Telephone Encounter (Signed)
Spoke with patient's daughter, [name], who called with concerns about patient being on amiodarone. She states the paperwork they received from the pharmacy states that the medication is always started in the hospital. I advised that we initiate this medication frequently from the outpatient clinic and that the monitoring is different than with tikosyn. She states her mother has had problems with nausea since prior to starting amiodarone because she doesn't eat and drink enough; the family is working with her to increase hydration and nutrition. I advised that she may have less nausea if she takes the amiodarone with food and daughter states she advised her mother of the same. I advised her to encourage her mother to continue the medication longer since it can take a while to be fully effective and to call back prior to appointment on 5/4 with Dr. Geraldo Pitter with concerns. [name] thanked me for the call.

## 2019-08-13 ENCOUNTER — Telehealth: Payer: Self-pay | Admitting: Cardiology

## 2019-08-13 DIAGNOSIS — R945 Abnormal results of liver function studies: Secondary | ICD-10-CM

## 2019-08-13 DIAGNOSIS — R7989 Other specified abnormal findings of blood chemistry: Secondary | ICD-10-CM

## 2019-08-13 NOTE — Telephone Encounter (Signed)
Pt c/o of Chest Pain: STAT if CP now or developed within 24 hours  1. Are you having CP right now? No- laight had pressure and tightness in chest   2. Are you experiencing any other symptoms (ex. SOB, nausea, vomiting, sweating)? no  3. How long have you been experiencing CP? This only happen last night seen she last seen Dr Geraldo Pitter  4. Is your CP continuous or coming and going?   5. Have you taken Nitroglycerin? No- she does not have Nitroglycerin- pt have appt until 08-25-19- daughter wonder if  She needs to be seen sooner ?

## 2019-08-14 NOTE — Telephone Encounter (Signed)
Left message on patients voicemail to please return our call.   

## 2019-08-17 NOTE — Addendum Note (Signed)
Addended by: Truddie Hidden on: 08/17/2019 01:43 PM   Modules accepted: Orders

## 2019-08-19 LAB — HEPATIC FUNCTION PANEL
ALT: 21 IU/L (ref 0–32)
AST: 25 IU/L (ref 0–40)
Albumin: 4 g/dL (ref 3.6–4.6)
Alkaline Phosphatase: 83 IU/L (ref 39–117)
Bilirubin Total: 0.4 mg/dL (ref 0.0–1.2)
Bilirubin, Direct: 0.13 mg/dL (ref 0.00–0.40)
Total Protein: 6.2 g/dL (ref 6.0–8.5)

## 2019-08-21 ENCOUNTER — Ambulatory Visit: Payer: Medicare Other | Admitting: Cardiology

## 2019-08-25 ENCOUNTER — Ambulatory Visit: Payer: Medicare Other | Admitting: Cardiology

## 2019-08-28 ENCOUNTER — Other Ambulatory Visit: Payer: Self-pay

## 2019-08-28 ENCOUNTER — Encounter: Payer: Self-pay | Admitting: Cardiology

## 2019-08-28 ENCOUNTER — Ambulatory Visit (INDEPENDENT_AMBULATORY_CARE_PROVIDER_SITE_OTHER): Payer: Medicare Other | Admitting: Cardiology

## 2019-08-28 VITALS — BP 200/90 | HR 50 | Ht 64.0 in | Wt 102.0 lb

## 2019-08-28 DIAGNOSIS — I10 Essential (primary) hypertension: Secondary | ICD-10-CM | POA: Diagnosis not present

## 2019-08-28 DIAGNOSIS — E782 Mixed hyperlipidemia: Secondary | ICD-10-CM | POA: Diagnosis not present

## 2019-08-28 DIAGNOSIS — I480 Paroxysmal atrial fibrillation: Secondary | ICD-10-CM | POA: Diagnosis not present

## 2019-08-28 DIAGNOSIS — I2510 Atherosclerotic heart disease of native coronary artery without angina pectoris: Secondary | ICD-10-CM | POA: Diagnosis not present

## 2019-08-28 MED ORDER — AMIODARONE HCL 200 MG PO TABS: ORAL_TABLET | ORAL | 2 refills | Status: DC

## 2019-08-28 MED ORDER — ALPRAZOLAM 0.25 MG PO TABS: 0.1250 mg | ORAL_TABLET | Freq: Every evening | ORAL | 0 refills | Status: DC | PRN

## 2019-08-28 NOTE — Addendum Note (Signed)
Addended by: Ronaldo Miyamoto on: 08/28/2019 02:38 PM   Modules accepted: Orders

## 2019-08-28 NOTE — Patient Instructions (Signed)
Medication Instructions:  Your physician recommends that you return for lab work in:   Decrease your Amiodarone to 200 mg daily for 2 weeks, then decrease to 100 mg daily.  *If you need a refill on your cardiac medications before your next appointment, please call your pharmacy*   Lab Work: None ordered If you have labs (blood work) drawn today and your tests are completely normal, you will receive your results only by: Marland Kitchen MyChart Message (if you have MyChart) OR . A paper copy in the mail If you have any lab test that is abnormal or we need to change your treatment, we will call you to review the results.   Testing/Procedures: none ordered   Follow-Up: At [facility], you and your health needs are our priority.  As part of our continuing mission to provide you with exceptional heart care, we have created designated Provider Care Teams.  These Care Teams include your primary Cardiologist (physician) and Advanced Practice Providers (APPs -  Physician Assistants and Nurse Practitioners) who all work together to provide you with the care you need, when you need it.  We recommend signing up for the patient portal called "MyChart".  Sign up information is provided on this After Visit Summary.  MyChart is used to connect with patients for Virtual Visits (Telemedicine).  Patients are able to view lab/test results, encounter notes, upcoming appointments, etc.  Non-urgent messages can be sent to your provider as well.   To learn more about what you can do with MyChart, go to [email or web address].    Your next appointment:   2 month(s)  The format for your next appointment:   In Person  Provider:   Jyl Heinz, MD   Other Instructions NA

## 2019-08-28 NOTE — Progress Notes (Signed)
Cardiology Office Note:    Date:  08/28/2019   ID:  Kristina Bruce, DOB 1930-11-08, MRN [id number]  PCP:  System, Pcp Not In  Cardiologist:  Jenean Lindau, MD   Referring MD: No ref. provider found    ASSESSMENT:    1. Atherosclerosis of native coronary artery of native heart without angina pectoris   2. Essential hypertension   3. Mixed hyperlipidemia   4. PAF (paroxysmal atrial fibrillation) (HCC)    PLAN:    In order of problems listed above:  1. Paroxysmal atrial fibrillation: EKG reveals sinus rhythm bradycardia nonspecific ST-T changes.I discussed with the patient atrial fibrillation, disease process. Management and therapy including rate and rhythm control, anticoagulation benefits and potential risks were discussed extensively with the patient. Patient had multiple questions which were answered to patient's satisfaction.  In view of the above I made the following recommendations and changes to her medicine.  I reduced amiodarone to 200 mg daily for 2 weeks and then half tablet daily which is 100 mg.  She is agreeable. 2. I have refilled her Xanax for 1 more time and I told her probably around she will have a primary care physician to this.  Precautions advised 3. Essential hypertension: Blood pressure is stable she has brought multiple readings for home from home and they are in the range of 1 40-1 50 and I will leave it at that at this time.  She has an element of whitecoat hypertension 2.  I think her blood pressures will settle down now that she feels very comfortable that her rhythm is under control.  We will continue to monitor this.  4.  Coronary artery disease: Secondary prevention stressed.  Importance of compliance with diet and medication stressed and she vocalized understanding.  Lipids followed by primary care physician. 5. Patient will be seen in follow-up appointment in 2 months or earlier if the patient has any concerns    Medication Adjustments/Labs and Tests  Ordered: Current medicines are reviewed at length with the patient today.  Concerns regarding medicines are outlined above.  No orders of the defined types were placed in this encounter.  No orders of the defined types were placed in this encounter.    Chief Complaint  Patient presents with  . Follow-up     History of Present Illness:    Kristina Bruce is a 86 y.o. female.  Patient has past medical history of coronary artery disease, essential hypertension and paroxysmal fibrillation.  She was here last time and she was extremely stressed about elevated heart rate.  I initiated on amiodarone therapy and she tells me that she is extremely happy about it and her heart rate is normal now and she is feeling much better and confident.  Her daughter is happy to and accompanies her for this visit.  No chest pain orthopnea or PND.  No palpitations.  At the time of my evaluation, the patient is alert awake oriented and in no distress.  Past Medical History:  Diagnosis Date  . Allergic rhinitis   . Coronary atherosclerosis of native coronary artery   . Essential hypertension 03/03/2019  . Essential hypertension, benign   . Essential hypertension, benign   . Intermediate coronary syndrome ([place])   . Mixed hyperlipidemia 03/03/2019  . Osteopenia    with Vitamin D Deficiency   . PAC (premature atrial contraction) 03/03/2019  . PAF (paroxysmal atrial fibrillation) ([place]) 07/31/2019  . Syncope and collapse 03/03/2019    History reviewed.  No pertinent surgical history.  Current Medications: Current Meds  Medication Sig  . ALPRAZolam (XANAX) 0.25 MG tablet Take 0.5 tablets (0.125 mg total) by mouth at bedtime as needed for anxiety.  Marland Kitchen amiodarone (PACERONE) 400 MG tablet Take 1 tablet (400 mg total) by mouth daily.  Marland Kitchen apixaban (ELIQUIS) 2.5 MG TABS tablet Take 1 tablet (2.5 mg total) by mouth 2 (two) times daily.  . Calcium Carbonate-Vitamin D (CALCIUM + D PO) Take by mouth daily.  .  ergocalciferol (VITAMIN D2) 1.25 MG (50000 UT) capsule Take by mouth once a week.   . fexofenadine (ALLEGRA) 60 MG tablet Take 60 mg by mouth as needed.   . fluticasone (FLONASE) 50 MCG/ACT nasal spray Place 1 spray into both nostrils as needed.   . metoprolol succinate (TOPROL-XL) 50 MG 24 hr tablet Take 0.5 tablets (25 mg total) by mouth daily. Take with or immediately following a meal.  . telmisartan (MICARDIS) 40 MG tablet Take 1 tablet (40 mg total) by mouth 2 (two) times daily.     Allergies:   Patient has no known allergies.   Social History   Socioeconomic History  . Marital status: Married    Spouse name: Not on file  . Number of children: Not on file  . Years of education: Not on file  . Highest education level: Not on file  Occupational History  . Not on file  Tobacco Use  . Smoking status: Never Smoker  . Smokeless tobacco: Never Used  Substance and Sexual Activity  . Alcohol use: No  . Drug use: No  . Sexual activity: Not on file  Other Topics Concern  . Not on file  Social History Narrative  . Not on file   Social Determinants of Health   Financial Resource Strain:   . Difficulty of Paying Living Expenses:   Food Insecurity:   . Worried About Charity fundraiser in the Last Year:   . Arboriculturist in the Last Year:   Transportation Needs:   . Film/video editor (Medical):   Marland Kitchen Lack of Transportation (Non-Medical):   Physical Activity:   . Days of Exercise per Week:   . Minutes of Exercise per Session:   Stress:   . Feeling of Stress :   Social Connections:   . Frequency of Communication with Friends and Family:   . Frequency of Social Gatherings with Friends and Family:   . Attends Religious Services:   . Active Member of Clubs or Organizations:   . Attends Archivist Meetings:   Marland Kitchen Marital Status:      Family History: The patient's family history includes Breast cancer in her mother; Heart attack in her son.  ROS:   Please see  the history of present illness.    All other systems reviewed and are negative.  EKGs/Labs/Other Studies Reviewed:    The following studies were reviewed today: IMPRESSIONS    1. Left ventricular ejection fraction, by visual estimation, is 60 to  65%. The left ventricle has normal function. There is no left ventricular  hypertrophy.  2. Left ventricular diastolic parameters are consistent with Grade I  diastolic dysfunction (impaired relaxation).  3. The mitral valve is normal in structure. Mild mitral valve  regurgitation. No evidence of mitral stenosis.  4. The aortic valve is normal in structure. Aortic valve regurgitation is  mild. No evidence of aortic valve sclerosis or stenosis.    Recent Labs: 07/24/2019: Magnesium 2.4; TSH 0.995  08/05/2019: Hemoglobin 13.8; Platelets 284 08/06/2019: BUN 14; Creatinine, Ser 0.78; Potassium 4.3; Sodium 135 08/18/2019: ALT 21  Recent Lipid Panel No results found for: CHOL, TRIG, HDL, CHOLHDL, VLDL, LDLCALC, LDLDIRECT  Physical Exam:    VS:  BP (!) 200/90   Pulse (!) 50   Ht 5\' 4"  (1.626 m)   Wt 102 lb (46.3 kg)   SpO2 100%   BMI 17.51 kg/m     Wt Readings from Last 3 Encounters:  08/28/19 102 lb (46.3 kg)  08/06/19 106 lb (48.1 kg)  08/02/19 106 lb 14.8 oz (48.5 kg)     GEN: Patient is in no acute distress HEENT: Normal NECK: No JVD; No carotid bruits LYMPHATICS: No lymphadenopathy CARDIAC: Hear sounds regular, 2/6 systolic murmur at the apex. RESPIRATORY:  Clear to auscultation without rales, wheezing or rhonchi  ABDOMEN: Soft, non-tender, non-distended MUSCULOSKELETAL:  No edema; No deformity  SKIN: Warm and dry NEUROLOGIC:  Alert and oriented x 3 PSYCHIATRIC:  Normal affect   Signed, Jenean Lindau, MD  08/28/2019 9:49 AM    [facility]

## 2019-09-17 ENCOUNTER — Telehealth: Payer: Self-pay | Admitting: Cardiology

## 2019-09-17 NOTE — Telephone Encounter (Signed)
I advised the pt to call her pharmacist that she gets her mediations filled at as they would be the best resource. Pt verbalized understanding and had no additional questions.

## 2019-09-17 NOTE — Telephone Encounter (Signed)
New Message:   Pt called and said on yesterday she took her Amiodarone twice. Yesterday evening she missed taking er Metoprolol. Today she took her regular dose of Amiodarone. Is there anything she needs to do, since she messed up taking her medicine yesterday?

## 2019-10-23 ENCOUNTER — Other Ambulatory Visit: Payer: Self-pay

## 2019-10-23 ENCOUNTER — Encounter: Payer: Self-pay | Admitting: Cardiology

## 2019-10-23 ENCOUNTER — Ambulatory Visit (INDEPENDENT_AMBULATORY_CARE_PROVIDER_SITE_OTHER): Payer: Medicare Other | Admitting: Cardiology

## 2019-10-23 VITALS — BP 172/76 | HR 56 | Ht 64.0 in | Wt 99.0 lb

## 2019-10-23 DIAGNOSIS — E782 Mixed hyperlipidemia: Secondary | ICD-10-CM

## 2019-10-23 DIAGNOSIS — I2510 Atherosclerotic heart disease of native coronary artery without angina pectoris: Secondary | ICD-10-CM

## 2019-10-23 DIAGNOSIS — I10 Essential (primary) hypertension: Secondary | ICD-10-CM

## 2019-10-23 DIAGNOSIS — I480 Paroxysmal atrial fibrillation: Secondary | ICD-10-CM

## 2019-10-23 MED ORDER — METOPROLOL SUCCINATE ER 50 MG PO TB24: 25.0000 mg | ORAL_TABLET | Freq: Every day | ORAL | 0 refills | Status: DC

## 2019-10-23 NOTE — Progress Notes (Signed)
Cardiology Office Note:    Date:  10/23/2019   ID:  Kristina Bruce, DOB 17-Jan-1931, MRN [id number]  PCP:  System, Pcp Not In  Cardiologist:  Jenean Lindau, MD   Referring MD: No ref. provider found    ASSESSMENT:    1. Atherosclerosis of native coronary artery of native heart without angina pectoris   2. Essential hypertension   3. Mixed hyperlipidemia   4. PAF (paroxysmal atrial fibrillation) (HCC)    PLAN:    In order of problems listed above:  1. Coronary artery disease: Secondary prevention stressed with the patient.  Importance of compliance with diet medication stressed and she vocalized understanding.  She has good effort tolerance and she will continue to increase it. 2. Essential hypertension: Blood pressure stable she has an element of whitecoat hypertension.  Blood pressure readings are fine at home. 3. Paroxysmal atrial fibrillation:I discussed with the patient atrial fibrillation, disease process. Management and therapy including rate and rhythm control, anticoagulation benefits and potential risks were discussed extensively with the patient. Patient had multiple questions which were answered to patient's satisfaction.  Mixed dyslipidemia: Diet was emphasized.  Patient is doing well with it. Patient will be seen in follow-up appointment in 3 months or earlier if the patient has any concerns    Medication Adjustments/Labs and Tests Ordered: Current medicines are reviewed at length with the patient today.  Concerns regarding medicines are outlined above.  No orders of the defined types were placed in this encounter.  No orders of the defined types were placed in this encounter.    No chief complaint on file.    History of Present Illness:    Kristina Bruce is a 86 y.o. female.  Patient has past medical history of coronary artery disease, essential hypertension dyslipidemia and proximal fibrillation.  She is on amiodarone therapy and tolerating it well.  No  chest pain orthopnea or PND.  She is feeling the best she has felt in the past several months.  Her family is very supportive.  At the time of my evaluation, the patient is alert awake oriented and in no distress.  Past Medical History:  Diagnosis Date  . Allergic rhinitis   . Coronary atherosclerosis of native coronary artery   . Essential hypertension 03/03/2019  . Essential hypertension, benign   . Essential hypertension, benign   . Intermediate coronary syndrome ([place])   . Mixed hyperlipidemia 03/03/2019  . Osteopenia    with Vitamin D Deficiency   . PAC (premature atrial contraction) 03/03/2019  . PAF (paroxysmal atrial fibrillation) ([place]) 07/31/2019  . Syncope and collapse 03/03/2019    History reviewed. No pertinent surgical history.  Current Medications: Current Meds  Medication Sig  . amiodarone (PACERONE) 200 MG tablet Take 200 mg daily for 2 weeks then start 100 mg (1/2 tablet) daily.  Marland Kitchen apixaban (ELIQUIS) 2.5 MG TABS tablet Take 1 tablet (2.5 mg total) by mouth 2 (two) times daily.  . Calcium Carbonate-Vitamin D (CALCIUM + D PO) Take by mouth daily.  . ergocalciferol (VITAMIN D2) 1.25 MG (50000 UT) capsule Take by mouth once a week.   . fexofenadine (ALLEGRA) 60 MG tablet Take 60 mg by mouth as needed.   . metoprolol succinate (TOPROL-XL) 50 MG 24 hr tablet Take 0.5 tablets (25 mg total) by mouth daily. Take with or immediately following a meal.  . telmisartan (MICARDIS) 40 MG tablet Take 1 tablet (40 mg total) by mouth 2 (two) times daily.  Allergies:   Patient has no known allergies.   Social History   Socioeconomic History  . Marital status: Married    Spouse name: Not on file  . Number of children: Not on file  . Years of education: Not on file  . Highest education level: Not on file  Occupational History  . Not on file  Tobacco Use  . Smoking status: Never Smoker  . Smokeless tobacco: Never Used  Vaping Use  . Vaping Use: Never used  Substance and  Sexual Activity  . Alcohol use: No  . Drug use: No  . Sexual activity: Not on file  Other Topics Concern  . Not on file  Social History Narrative  . Not on file   Social Determinants of Health   Financial Resource Strain:   . Difficulty of Paying Living Expenses:   Food Insecurity:   . Worried About Charity fundraiser in the Last Year:   . Arboriculturist in the Last Year:   Transportation Needs:   . Film/video editor (Medical):   Marland Kitchen Lack of Transportation (Non-Medical):   Physical Activity:   . Days of Exercise per Week:   . Minutes of Exercise per Session:   Stress:   . Feeling of Stress :   Social Connections:   . Frequency of Communication with Friends and Family:   . Frequency of Social Gatherings with Friends and Family:   . Attends Religious Services:   . Active Member of Clubs or Organizations:   . Attends Archivist Meetings:   Marland Kitchen Marital Status:      Family History: The patient's family history includes Breast cancer in her mother; Heart attack in her son.  ROS:   Please see the history of present illness.    All other systems reviewed and are negative.  EKGs/Labs/Other Studies Reviewed:    The following studies were reviewed today: I discussed my findings with the patient at length   Recent Labs: 07/24/2019: Magnesium 2.4; TSH 0.995 08/05/2019: Hemoglobin 13.8; Platelets 284 08/06/2019: BUN 14; Creatinine, Ser 0.78; Potassium 4.3; Sodium 135 08/18/2019: ALT 21  Recent Lipid Panel No results found for: CHOL, TRIG, HDL, CHOLHDL, VLDL, LDLCALC, LDLDIRECT  Physical Exam:    VS:  BP (!) 172/76   Pulse (!) 56   Ht 5\' 4"  (1.626 m)   Wt 99 lb (44.9 kg)   SpO2 98%   BMI 16.99 kg/m     Wt Readings from Last 3 Encounters:  10/23/19 99 lb (44.9 kg)  08/28/19 102 lb (46.3 kg)  08/06/19 106 lb (48.1 kg)     GEN: Patient is in no acute distress HEENT: Normal NECK: No JVD; No carotid bruits LYMPHATICS: No lymphadenopathy CARDIAC: Hear  sounds regular, 2/6 systolic murmur at the apex. RESPIRATORY:  Clear to auscultation without rales, wheezing or rhonchi  ABDOMEN: Soft, non-tender, non-distended MUSCULOSKELETAL:  No edema; No deformity  SKIN: Warm and dry NEUROLOGIC:  Alert and oriented x 3 PSYCHIATRIC:  Normal affect   Signed, Jenean Lindau, MD  10/23/2019 11:53 AM    [place]

## 2019-10-23 NOTE — Patient Instructions (Signed)

## 2019-10-23 NOTE — Addendum Note (Signed)
Addended by: Truddie Hidden on: 10/23/2019 12:07 PM   Modules accepted: Orders

## 2019-10-28 ENCOUNTER — Other Ambulatory Visit: Payer: Self-pay | Admitting: Cardiology

## 2019-10-28 MED ORDER — TELMISARTAN 40 MG PO TABS: 40.0000 mg | ORAL_TABLET | Freq: Two times a day (BID) | ORAL | 0 refills | Status: AC

## 2019-10-28 NOTE — Telephone Encounter (Signed)
*  STAT* If patient is at the pharmacy, call can be transferred to refill team.   1. Which medications need to be refilled? (please list name of each medication and dose if known) telmisartan (MICARDIS) 40 MG tablet  2. Which pharmacy/location (including street and city if local pharmacy) is medication to be sent to? [facility] in [place]  3. Do they need a 30 day or 90 day supply? 10 day supply  Patient will be out tomorrow morning. Patient's daughter requesting enough until [facility]/17. Patient's daughter would also like a call letting her know when the prescription is sent to the pharmacy.

## 2019-10-28 NOTE — Telephone Encounter (Signed)
Refill sent in per request and daughter notified

## 2019-11-20 ENCOUNTER — Encounter: Payer: Self-pay | Admitting: Cardiology

## 2019-11-20 ENCOUNTER — Ambulatory Visit (INDEPENDENT_AMBULATORY_CARE_PROVIDER_SITE_OTHER): Payer: Medicare Other | Admitting: Cardiology

## 2019-11-20 ENCOUNTER — Other Ambulatory Visit: Payer: Self-pay

## 2019-11-20 VITALS — BP 190/88 | HR 56 | Ht 64.0 in | Wt 101.0 lb

## 2019-11-20 DIAGNOSIS — I480 Paroxysmal atrial fibrillation: Secondary | ICD-10-CM

## 2019-11-20 DIAGNOSIS — E782 Mixed hyperlipidemia: Secondary | ICD-10-CM

## 2019-11-20 DIAGNOSIS — I10 Essential (primary) hypertension: Secondary | ICD-10-CM | POA: Diagnosis not present

## 2019-11-20 DIAGNOSIS — I2510 Atherosclerotic heart disease of native coronary artery without angina pectoris: Secondary | ICD-10-CM

## 2019-11-20 MED ORDER — APIXABAN 2.5 MG PO TABS: 2.5000 mg | ORAL_TABLET | Freq: Two times a day (BID) | ORAL | 1 refills | Status: DC

## 2019-11-20 NOTE — Patient Instructions (Signed)
Medication Instructions:  Your physician recommends that you continue on your current medications as directed. Please refer to the Current Medication list given to you today.  *If you need a refill on your cardiac medications before your next appointment, please call your pharmacy*   Lab Work: TODAY:  BMET & LFT  If you have labs (blood work) drawn today and your tests are completely normal, you will receive your results only by: Marland Kitchen MyChart Message (if you have MyChart) OR . A paper copy in the mail If you have any lab test that is abnormal or we need to change your treatment, we will call you to review the results.   Testing/Procedures: None ordered   Follow-Up: At [facility], you and your health needs are our priority.  As part of our continuing mission to provide you with exceptional heart care, we have created designated Provider Care Teams.  These Care Teams include your primary Cardiologist (physician) and Advanced Practice Providers (APPs -  Physician Assistants and Nurse Practitioners) who all work together to provide you with the care you need, when you need it.  We recommend signing up for the patient portal called "MyChart".  Sign up information is provided on this After Visit Summary.  MyChart is used to connect with patients for Virtual Visits (Telemedicine).  Patients are able to view lab/test results, encounter notes, upcoming appointments, etc.  Non-urgent messages can be sent to your provider as well.   To learn more about what you can do with MyChart, go to [email or web address].    Your next appointment:   6 month(s)  The format for your next appointment:   In Person  Provider:   Jyl Heinz, MD   Other Instructions

## 2019-11-20 NOTE — Addendum Note (Signed)
Addended by: Gaetano Net on: 11/20/2019 11:58 AM   Modules accepted: Orders

## 2019-11-20 NOTE — Progress Notes (Signed)
Cardiology Office Note:    Date:  11/20/2019   ID:  Kristina Bruce, DOB 09-14-1930, MRN [id number]  PCP:  Aura Dials, MD  Cardiologist:  Jenean Lindau, MD   Referring MD: No ref. provider found    ASSESSMENT:    1. Atherosclerosis of native coronary artery of native heart without angina pectoris   2. Essential hypertension   3. Mixed hyperlipidemia   4. PAF (paroxysmal atrial fibrillation) (HCC)    PLAN:    In order of problems listed above:  1. Coronary atherosclerosis: Secondary prevention stressed with the patient.  Importance of compliance with diet medication stressed and she vocalized understanding. Essential hypertension: Blood pressure stable and home readings are fine.  She has an element of whitecoat hypertension.  Diet was emphasized. Mixed dyslipidemia: Patient takes medications appropriately and is also dieting well. Paroxysmal atrial fibrillation:I discussed with the patient atrial fibrillation, disease process. Management and therapy including rate and rhythm control, anticoagulation benefits and potential risks were discussed extensively with the patient. Patient had multiple questions which were answered to patient's satisfaction. In view of history of nausea which is only occasional and sporadic we will do a Chem-7 and LFTs.  She will follow up with the primary care physician if this continues.  Her cough is completely resolved. Patient will be seen in follow-up appointment in 6 months or earlier if the patient has any concerns   Medication Adjustments/Labs and Tests Ordered: Current medicines are reviewed at length with the patient today.  Concerns regarding medicines are outlined above.  No orders of the defined types were placed in this encounter.  No orders of the defined types were placed in this encounter.    No chief complaint on file.    History of Present Illness:    Kristina Bruce is a 86 y.o. female.  Patient has past medical history  of coronary atherosclerosis, essential hypertension mixed dyslipidemia and proximal fibrillation.  She denies any problems at this time and takes care of activities of daily living.  No chest pain orthopnea or PND.  She has some episodes of nausea at times and she had an episode of cough which is completely resolved.  At the time of my evaluation, the patient is alert awake oriented and in no distress.  Past Medical History:  Diagnosis Date  . Allergic rhinitis   . Coronary atherosclerosis of native coronary artery   . Essential hypertension 03/03/2019  . Essential hypertension, benign   . Essential hypertension, benign   . Intermediate coronary syndrome ([place])   . Mixed hyperlipidemia 03/03/2019  . Osteopenia    with Vitamin D Deficiency   . PAC (premature atrial contraction) 03/03/2019  . PAF (paroxysmal atrial fibrillation) ([place]) 07/31/2019  . Syncope and collapse 03/03/2019    No past surgical history on file.  Current Medications: Current Meds  Medication Sig  . amiodarone (PACERONE) 200 MG tablet Take 200 mg daily for 2 weeks then start 100 mg (1/2 tablet) daily.  Marland Kitchen apixaban (ELIQUIS) 2.5 MG TABS tablet Take 1 tablet (2.5 mg total) by mouth 2 (two) times daily.  . Calcium Carbonate-Vitamin D (CALCIUM + D PO) Take by mouth daily.  . ergocalciferol (VITAMIN D2) 1.25 MG (50000 UT) capsule Take by mouth once a week.   . fexofenadine (ALLEGRA) 60 MG tablet Take 60 mg by mouth as needed.   . metoprolol succinate (TOPROL-XL) 50 MG 24 hr tablet Take 0.5 tablets (25 mg total) by mouth daily. Take with or  immediately following a meal.  . telmisartan (MICARDIS) 40 MG tablet Take 1 tablet (40 mg total) by mouth 2 (two) times daily.     Allergies:   Patient has no known allergies.   Social History   Socioeconomic History  . Marital status: Married    Spouse name: Not on file  . Number of children: Not on file  . Years of education: Not on file  . Highest education level: Not on file    Occupational History  . Not on file  Tobacco Use  . Smoking status: Never Smoker  . Smokeless tobacco: Never Used  Vaping Use  . Vaping Use: Never used  Substance and Sexual Activity  . Alcohol use: No  . Drug use: No  . Sexual activity: Not on file  Other Topics Concern  . Not on file  Social History Narrative  . Not on file   Social Determinants of Health   Financial Resource Strain:   . Difficulty of Paying Living Expenses:   Food Insecurity:   . Worried About Charity fundraiser in the Last Year:   . Arboriculturist in the Last Year:   Transportation Needs:   . Film/video editor (Medical):   Marland Kitchen Lack of Transportation (Non-Medical):   Physical Activity:   . Days of Exercise per Week:   . Minutes of Exercise per Session:   Stress:   . Feeling of Stress :   Social Connections:   . Frequency of Communication with Friends and Family:   . Frequency of Social Gatherings with Friends and Family:   . Attends Religious Services:   . Active Member of Clubs or Organizations:   . Attends Archivist Meetings:   Marland Kitchen Marital Status:      Family History: The patient's family history includes Breast cancer in her mother; Heart attack in her son.  ROS:   Please see the history of present illness.    All other systems reviewed and are negative.  EKGs/Labs/Other Studies Reviewed:    The following studies were reviewed today: I discussed my findings with the patient at length.   Recent Labs: 07/24/2019: Magnesium 2.4; TSH 0.995 08/05/2019: Hemoglobin 13.8; Platelets 284 08/06/2019: BUN 14; Creatinine, Ser 0.78; Potassium 4.3; Sodium 135 08/18/2019: ALT 21  Recent Lipid Panel No results found for: CHOL, TRIG, HDL, CHOLHDL, VLDL, LDLCALC, LDLDIRECT  Physical Exam:    VS:  BP (!) 190/88   Pulse 56   Ht 5\' 4"  (1.626 m)   Wt 101 lb 0.6 oz (45.8 kg)   SpO2 99%   BMI 17.34 kg/m     Wt Readings from Last 3 Encounters:  11/20/19 101 lb 0.6 oz (45.8 kg)  10/23/19  99 lb (44.9 kg)  08/28/19 102 lb (46.3 kg)     GEN: Patient is in no acute distress HEENT: Normal NECK: No JVD; No carotid bruits LYMPHATICS: No lymphadenopathy CARDIAC: Hear sounds regular, 2/6 systolic murmur at the apex. RESPIRATORY:  Clear to auscultation without rales, wheezing or rhonchi  ABDOMEN: Soft, non-tender, non-distended MUSCULOSKELETAL:  No edema; No deformity  SKIN: Warm and dry NEUROLOGIC:  Alert and oriented x 3 PSYCHIATRIC:  Normal affect   Signed, Jenean Lindau, MD  11/20/2019 11:25 AM    [place]

## 2019-11-21 LAB — BASIC METABOLIC PANEL
BUN/Creatinine Ratio: 19 (ref 12–28)
BUN: 14 mg/dL (ref 8–27)
CO2: 25 mmol/L (ref 20–29)
Calcium: 9.3 mg/dL (ref 8.7–10.3)
Chloride: 96 mmol/L (ref 96–106)
Creatinine, Ser: 0.72 mg/dL (ref 0.57–1.00)
GFR calc Af Amer: 88 mL/min/{1.73_m2} (ref >59)
GFR calc non Af Amer: 76 mL/min/{1.73_m2} (ref >59)
Glucose: 86 mg/dL (ref 65–99)
Potassium: 4.3 mmol/L (ref 3.5–5.2)
Sodium: 133 mmol/L — ABNORMAL LOW (ref 134–144)

## 2019-11-21 LAB — HEPATIC FUNCTION PANEL
ALT: 13 IU/L (ref 0–32)
AST: 24 IU/L (ref 0–40)
Albumin: 4.2 g/dL (ref 3.6–4.6)
Alkaline Phosphatase: 62 IU/L (ref 48–121)
Bilirubin Total: 0.4 mg/dL (ref 0.0–1.2)
Bilirubin, Direct: 0.11 mg/dL (ref 0.00–0.40)
Total Protein: 6.8 g/dL (ref 6.0–8.5)

## 2019-12-31 ENCOUNTER — Other Ambulatory Visit: Payer: Self-pay | Admitting: Cardiovascular Disease

## 2019-12-31 NOTE — Telephone Encounter (Signed)
Prescription refill request for Eliquis received. Indication: Atrial Fibrillation Last office visit: 11/20/2019 Revankar Scr: 0.72 10/2019 Age: 86 Weight: 45.8 kg  Prescription refilled

## 2020-01-01 ENCOUNTER — Ambulatory Visit (INDEPENDENT_AMBULATORY_CARE_PROVIDER_SITE_OTHER): Payer: Medicare Other | Admitting: Nurse Practitioner

## 2020-01-01 ENCOUNTER — Other Ambulatory Visit: Payer: Self-pay

## 2020-01-01 ENCOUNTER — Encounter: Payer: Self-pay | Admitting: Nurse Practitioner

## 2020-01-01 VITALS — BP 136/80 | HR 59 | Temp 96.9°F | Ht 63.08 in | Wt 100.8 lb

## 2020-01-01 DIAGNOSIS — I10 Essential (primary) hypertension: Secondary | ICD-10-CM

## 2020-01-01 DIAGNOSIS — R630 Anorexia: Secondary | ICD-10-CM

## 2020-01-01 DIAGNOSIS — Z23 Encounter for immunization: Secondary | ICD-10-CM

## 2020-01-01 DIAGNOSIS — I2510 Atherosclerotic heart disease of native coronary artery without angina pectoris: Secondary | ICD-10-CM

## 2020-01-01 DIAGNOSIS — G4700 Insomnia, unspecified: Secondary | ICD-10-CM

## 2020-01-01 DIAGNOSIS — R413 Other amnesia: Secondary | ICD-10-CM

## 2020-01-01 DIAGNOSIS — I480 Paroxysmal atrial fibrillation: Secondary | ICD-10-CM

## 2020-01-01 MED ORDER — MIRTAZAPINE 7.5 MG PO TABS: 7.5000 mg | ORAL_TABLET | Freq: Every day | ORAL | 1 refills | Status: DC

## 2020-01-01 NOTE — Progress Notes (Signed)
Careteam: Patient Care Team: Aura Dials, MD as PCP - General (Family Medicine) Berniece Salines, DO as PCP - Cardiology (Cardiology) Revankar, Reita Cliche, MD as Consulting Physician (Cardiology) Princess Bruins, MD as Referring Physician (Ophthalmology)  PLACE OF SERVICE:  [facility] Directive information    No Known Allergies  Chief Complaint  Patient presents with  . Establish Care    New patient establish care. Here with daughter   . Nutrition Counseling    Loss of appetite, reoccuring nausea (? if related to heart medications), weight loss, and dehydration  . Fatigue    Insomina, anxiety and mental foggyness  . Immunizations    Refused flu vaccine today      HPI: Patient is a 86 y.o. female to establish care.   Has been going to [facility] physician in [place], unsure when her last AWV and physical.   [facility]. Daughter wanted the change.  She lives alone, daughter lives close by. Daughter does her medication. She is forgetful  Choroidal nevus- will get injections at ophthalmologist due to this, sometimes uncomfortable will use tylenol occasionally for this.   A fib- on amiodarone and metoprolol 0.64m 1/2 tablet daily for rate control. Will have occasional palpations and chest discomfort- also associated this with being upset.  Currently on eliquis 2.5 mg by mouth twice daily for anticoagulation.   htn- on telmisartan 40 mg daily   Osteoporosis- had bone density at breast center, not sure if she is on cal and vit d tablet but is taking vit D 50,000 units weekly  Vit d Def- on vit d 50,000 units weekly.   No appetite, always hungry. Weight loss noted by daughter- 10 lbs in the last year. She has been 100 lbs for several months. Using nutritional supplement daily  seasonal allergies controlled at this time using allegra PRN  Pt reports her memory is not good and balance is not good and does not sleep  well. Mentioned to last PCP but did not really address it.    Review of Systems:  Review of Systems  Constitutional: Negative for chills, fever and weight loss.  HENT: Negative for tinnitus.   Respiratory: Negative for cough, sputum production and shortness of breath.   Cardiovascular: Positive for palpitations. Negative for chest pain and leg swelling.  Gastrointestinal: Negative for abdominal pain, constipation, diarrhea and heartburn.  Genitourinary: Negative for dysuria, frequency and urgency.  Musculoskeletal: Negative for back pain, falls, joint pain and myalgias.  Skin: Negative.   Neurological: Negative for dizziness and headaches.  Psychiatric/Behavioral: Positive for depression and memory loss. The patient has insomnia.     Past Medical History:  Diagnosis Date  . Allergic rhinitis   . Choroidal nevus of right eye    [facility]  . Coronary atherosclerosis of native coronary artery   . Dehydration    [facility]  . Essential hypertension 03/03/2019  . Essential hypertension, benign   . Essential hypertension, benign   . H/O mammogram 2020   [facility]  . [place]   . Loss of appetite    [facility]  . Low blood pressure    [facility]  . Mixed hyperlipidemia 03/03/2019  . Nausea    [facility]  . Osteopenia    with Vitamin D Deficiency   .  Osteoporosis    Per [facility] new patient packet  . PAC (premature atrial contraction) 03/03/2019  . PAF (paroxysmal atrial fibrillation) ([place]) 07/31/2019  . Syncope and collapse 03/03/2019   History reviewed. No pertinent surgical history. Social History:   reports that she has quit smoking. She quit after 16.00 years of use. She has never used smokeless tobacco. She reports that she does not drink alcohol and does not use drugs.  Family History  Problem Relation Age of Onset  . Breast cancer  Mother   . Heart attack Son        stents    Medications: Patient's Medications  New Prescriptions   No medications on file  Previous Medications   ACETAMINOPHEN (TYLENOL PO)    Take by mouth. As needed, not sure of dose   AMIODARONE (PACERONE) 200 MG TABLET    Take 100 mg by mouth daily.   CALCIUM CARBONATE-VITAMIN D (CALCIUM + D PO)    Take by mouth daily.   ELIQUIS 2.5 MG TABS TABLET    TAKE 1 TABLET TWICE A DAY   ERGOCALCIFEROL (VITAMIN D2) 1.25 MG (50000 UT) CAPSULE    Take by mouth once a week.    FEXOFENADINE (ALLEGRA) 60 MG TABLET    Take 60 mg by mouth as needed.    METOPROLOL SUCCINATE (TOPROL-XL) 50 MG 24 HR TABLET    Take 0.5 tablets (25 mg total) by mouth daily. Take with or immediately following a meal.   NUTRITIONAL SUPPLEMENTS (NUTRI-DRINK PO)    Take by mouth daily. [employer]   TELMISARTAN (MICARDIS) 40 MG TABLET    Take 1 tablet (40 mg total) by mouth 2 (two) times daily.  Modified Medications   No medications on file  Discontinued Medications   AMIODARONE (PACERONE) 200 MG TABLET    Take 200 mg daily for 2 weeks then start 100 mg (1/2 tablet) daily.    Physical Exam:  Vitals:   01/01/20 1341  BP: 136/80  Pulse: (!) 59  Temp: (!) 96.9 F (36.1 C)  TempSrc: Temporal  SpO2: 97%  Weight: 100 lb 12.8 oz (45.7 kg)  Height: 5' 3.08" (1.602 m)   Body mass index is 17.81 kg/m. Wt Readings from Last 3 Encounters:  01/01/20 100 lb 12.8 oz (45.7 kg)  11/20/19 101 lb 0.6 oz (45.8 kg)  10/23/19 99 lb (44.9 kg)    Physical Exam Constitutional:      General: She is not in acute distress.    Appearance: She is well-developed. She is not diaphoretic.  HENT:     Head: Normocephalic and atraumatic.  Eyes:     Conjunctiva/sclera: Conjunctivae normal.     Pupils: Pupils are equal, round, and reactive to light.  Cardiovascular:     Rate and Rhythm: Normal rate and regular rhythm.     Heart sounds: Normal heart sounds.  Pulmonary:     Effort: Pulmonary effort  is normal.     Breath sounds: Normal breath sounds.  Abdominal:     General: Bowel sounds are normal.     Palpations: Abdomen is soft.  Musculoskeletal:        General: No tenderness.     Cervical back: Normal range of motion and neck supple.  Skin:    General: Skin is warm and dry.  Neurological:     Mental Status: She is alert and oriented to person, place, and time.  Psychiatric:        Mood and Affect: Mood normal.  Behavior: Behavior normal.     Labs reviewed: Basic Metabolic Panel: Recent Labs    07/24/19 1325 08/02/19 2004 08/05/19 0024 08/06/19 1700 11/20/19 1140  NA 134   < > 134* 135 133*  K 4.4   < > 3.9 4.3 4.3  CL 97   < > 100 100 96  CO2 25   < > 23 22 25   GLUCOSE 94   < > 127* 98 86  BUN 9   < > 15 14 14   CREATININE 0.67   < > 0.66 0.78 0.72  CALCIUM 9.5   < > 9.4 9.4 9.3  MG 2.4*  --   --   --   --   TSH 0.995  --   --   --   --    < > = values in this interval not displayed.   Liver Function Tests: Recent Labs    08/06/19 1700 08/18/19 1009 11/20/19 1140  AST 40 25 24  ALT 47* 21 13  ALKPHOS 105 83 62  BILITOT 0.6 0.4 0.4  PROT 6.5 6.2 6.8  ALBUMIN 4.3 4.0 4.2   No results for input(s): LIPASE, AMYLASE in the last 8760 hours. No results for input(s): AMMONIA in the last 8760 hours. CBC: Recent Labs    02/23/19 1047 08/02/19 2004 08/05/19 0024  WBC 8.1 13.0* 12.3*  NEUTROABS 6.2 9.6* 9.0*  HGB 13.3 13.7 13.8  HCT 41.2 42.5 43.2  MCV 92.0 89.7 90.6  PLT 268 281 284   Lipid Panel: No results for input(s): CHOL, HDL, LDLCALC, TRIG, CHOLHDL, LDLDIRECT in the last 8760 hours. TSH: Recent Labs    07/24/19 1325  TSH 0.995   A1C: No results found for: HGBA1C   Assessment/Plan 1. Insomnia, unspecified type - mirtazapine (REMERON) 7.5 MG tablet; Take 1 tablet (7.5 mg total) by mouth at bedtime.  Dispense: 30 tablet; Refill: 1  2. Decreased appetite With nausea at times, question if nausea is related to not eating enough  or eating causing increase in nausea.  - mirtazapine (REMERON) 7.5 MG tablet; Take 1 tablet (7.5 mg total) by mouth at bedtime.  Dispense: 30 tablet; Refill: 1  3. Essential hypertension Controlled on metoprolol 50 mg daily and telmisartan 40 mg daily with dietary modifications.   4. Memory loss -had CT head in April of this year, stable cerebral white matter changes since November noted.  November 2020 impression: Atrophy with mild periventricular small vessel disease. No acute infarct evident. No mass or hemorrhage. Will need to follow up MMSE  No acute abnormalities. Will check for reversible causes  - CMP with eGFR([facility]) - RPR - CBC with Differential/Platelet - TSH - Vitamin B12  5. Need for influenza vaccination - Flu Vaccine QUAD High Dose(Fluad)  6. PAF (paroxysmal atrial fibrillation) (HCC) Rate controlled on amiodarone and metoprolol, eliquis 2.5 mg BID for anticoaguation.  7. Atherosclerosis of native coronary artery of native heart without angina pectoris Stable. Continues on eliquis for anticoagulation, not currently on statin, will need follow up lipids   Next appt: 6 weeks, also needs to schedule AWV Siya Flurry K. Gonzales, [facility] [phone]

## 2020-01-01 NOTE — Patient Instructions (Signed)
To schedule AWV via telephone visit  Follow up in office in 6 weeks for follow up  Start Remeron at bedtime for sleep, mood, and appetite.  Lab work today    Insomnia Insomnia is a sleep disorder that makes it difficult to fall asleep or stay asleep. Insomnia can cause fatigue, low energy, difficulty concentrating, mood swings, and poor performance at work or school. There are three different ways to classify insomnia:  Difficulty falling asleep.  Difficulty staying asleep.  Waking up too early in the morning. Any type of insomnia can be long-term (chronic) or short-term (acute). Both are common. Short-term insomnia usually lasts for three months or less. Chronic insomnia occurs at least three times a week for longer than three months. What are the causes? Insomnia may be caused by another condition, situation, or substance, such as:  Anxiety.  Certain medicines.  Gastroesophageal reflux disease (GERD) or other gastrointestinal conditions.  Asthma or other breathing conditions.  Restless legs syndrome, sleep apnea, or other sleep disorders.  Chronic pain.  Menopause.  Stroke.  Abuse of alcohol, tobacco, or illegal drugs.  Mental health conditions, such as depression.  Caffeine.  Neurological disorders, such as Alzheimer's disease.  An overactive thyroid (hyperthyroidism). Sometimes, the cause of insomnia may not be known. What increases the risk? Risk factors for insomnia include:  Gender. Women are affected more often than men.  Age. Insomnia is more common as you get older.  Stress.  Lack of exercise.  Irregular work schedule or working night shifts.  Traveling between different time zones.  Certain medical and mental health conditions. What are the signs or symptoms? If you have insomnia, the main symptom is having trouble falling asleep or having trouble staying asleep. This may lead to other symptoms, such as:  Feeling fatigued or having low  energy.  Feeling nervous about going to sleep.  Not feeling rested in the morning.  Having trouble concentrating.  Feeling irritable, anxious, or depressed. How is this diagnosed? This condition may be diagnosed based on:  Your symptoms and medical history. Your health care provider may ask about: ? Your sleep habits. ? Any medical conditions you have. ? Your mental health.  A physical exam. How is this treated? Treatment for insomnia depends on the cause. Treatment may focus on treating an underlying condition that is causing insomnia. Treatment may also include:  Medicines to help you sleep.  Counseling or therapy.  Lifestyle adjustments to help you sleep better. Follow these instructions at home: Eating and drinking   Limit or avoid alcohol, caffeinated beverages, and cigarettes, especially close to bedtime. These can disrupt your sleep.  Do not eat a large meal or eat spicy foods right before bedtime. This can lead to digestive discomfort that can make it hard for you to sleep. Sleep habits   Keep a sleep diary to help you and your health care provider figure out what could be causing your insomnia. Write down: ? When you sleep. ? When you wake up during the night. ? How well you sleep. ? How rested you feel the next day. ? Any side effects of medicines you are taking. ? What you eat and drink.  Make your bedroom a dark, comfortable place where it is easy to fall asleep. ? Put up shades or blackout curtains to block light from outside. ? Use a white noise machine to block noise. ? Keep the temperature cool.  Limit screen use before bedtime. This includes: ? Watching TV. ? Using  your smartphone, tablet, or computer.  Stick to a routine that includes going to bed and waking up at the same times every day and night. This can help you fall asleep faster. Consider making a quiet activity, such as reading, part of your nighttime routine.  Try to avoid taking naps  during the day so that you sleep better at night.  Get out of bed if you are still awake after 15 minutes of trying to sleep. Keep the lights down, but try reading or doing a quiet activity. When you feel sleepy, go back to bed. General instructions  Take over-the-counter and prescription medicines only as told by your health care provider.  Exercise regularly, as told by your health care provider. Avoid exercise starting several hours before bedtime.  Use relaxation techniques to manage stress. Ask your health care provider to suggest some techniques that may work well for you. These may include: ? Breathing exercises. ? Routines to release muscle tension. ? Visualizing peaceful scenes.  Make sure that you drive carefully. Avoid driving if you feel very sleepy.  Keep all follow-up visits as told by your health care provider. This is important. Contact a health care provider if:  You are tired throughout the day.  You have trouble in your daily routine due to sleepiness.  You continue to have sleep problems, or your sleep problems get worse. Get help right away if:  You have serious thoughts about hurting yourself or someone else. If you ever feel like you may hurt yourself or others, or have thoughts about taking your own life, get help right away. You can go to your nearest emergency department or call:  Your local emergency services (911 in the [place]  A suicide crisis helpline, such as the [facility] at 8451334100. This is open 24 hours a day. Summary  Insomnia is a sleep disorder that makes it difficult to fall asleep or stay asleep.  Insomnia can be long-term (chronic) or short-term (acute).  Treatment for insomnia depends on the cause. Treatment may focus on treating an underlying condition that is causing insomnia.  Keep a sleep diary to help you and your health care provider figure out what could be causing your insomnia. This  information is not intended to replace advice given to you by your health care provider. Make sure you discuss any questions you have with your health care provider. Document Revised: 03/22/2017 Document Reviewed: 01/17/2017 Elsevier Patient Education  2020 [employer].

## 2020-01-03 ENCOUNTER — Other Ambulatory Visit: Payer: Self-pay | Admitting: Cardiology

## 2020-01-04 LAB — COMPLETE METABOLIC PANEL WITH GFR
AG Ratio: 1.3 (calc) (ref 1.0–2.5)
ALT: 11 U/L (ref 6–29)
AST: 16 U/L (ref 10–35)
Albumin: 3.9 g/dL (ref 3.6–5.1)
Alkaline phosphatase (APISO): 83 U/L (ref 37–153)
BUN: 17 mg/dL (ref 7–25)
CO2: 27 mmol/L (ref 20–32)
Calcium: 9.3 mg/dL (ref 8.6–10.4)
Chloride: 96 mmol/L — ABNORMAL LOW (ref 98–110)
Creat: 0.61 mg/dL (ref 0.60–0.88)
GFR, Est African American: 95 mL/min/{1.73_m2} (ref >=60)
GFR, Est Non African American: 82 mL/min/{1.73_m2} (ref >=60)
Globulin: 2.9 g/dL (calc) (ref 1.9–3.7)
Glucose, Bld: 88 mg/dL (ref 65–139)
Potassium: 4.5 mmol/L (ref 3.5–5.3)
Sodium: 133 mmol/L — ABNORMAL LOW (ref 135–146)
Total Bilirubin: 0.5 mg/dL (ref 0.2–1.2)
Total Protein: 6.8 g/dL (ref 6.1–8.1)

## 2020-01-04 LAB — CBC WITH DIFFERENTIAL/PLATELET
Absolute Monocytes: 909 cells/uL (ref 200–950)
Basophils Absolute: 23 cells/uL (ref 0–200)
Basophils Relative: 0.2 %
Eosinophils Absolute: 380 cells/uL (ref 15–500)
Eosinophils Relative: 3.3 %
HCT: 34.6 % — ABNORMAL LOW (ref 35.0–45.0)
Hemoglobin: 11.6 g/dL — ABNORMAL LOW (ref 11.7–15.5)
Lymphs Abs: 1484 cells/uL (ref 850–3900)
MCH: 30.6 pg (ref 27.0–33.0)
MCHC: 33.5 g/dL (ref 32.0–36.0)
MCV: 91.3 fL (ref 80.0–100.0)
MPV: 10 fL (ref 7.5–12.5)
Monocytes Relative: 7.9 %
Neutro Abs: 8706 cells/uL — ABNORMAL HIGH (ref 1500–7800)
Neutrophils Relative %: 75.7 %
Platelets: 293 10*3/uL (ref 140–400)
RBC: 3.79 10*6/uL — ABNORMAL LOW (ref 3.80–5.10)
RDW: 13.1 % (ref 11.0–15.0)
Total Lymphocyte: 12.9 %
WBC: 11.5 10*3/uL — ABNORMAL HIGH (ref 3.8–10.8)

## 2020-01-04 LAB — TSH: TSH: 1.61 mIU/L (ref 0.40–4.50)

## 2020-01-04 LAB — VITAMIN B12: Vitamin B-12: 611 pg/mL (ref 200–1100)

## 2020-01-04 LAB — RPR: RPR Ser Ql: NONREACTIVE

## 2020-01-05 ENCOUNTER — Telehealth: Payer: Self-pay

## 2020-01-05 NOTE — Telephone Encounter (Signed)
Okay to stop remeron and resume after DMV appt.   I agree with the pharmacy recommendations. Can also use humidifier in home to make sure air at home is not too dry

## 2020-01-05 NOTE — Telephone Encounter (Signed)
Incoming call received from patients daughter [name] with multiple concerns  1.) Patient was prescribed mirtazapine by Janett Billow on 01/01/2020. Patient started medication on Sunday. Since starting medication patient is very drowsy and has a hard time getting up the following day. Patient is scheduled to go to the [facility] on Friday at 9:45 am to renew her license and her daughter questions if patient can d/c medication today and resume Friday night?  2.) Patient also with nosebleeds x several months. Patients daughter was not aware that patient was having them so often. Last nosebleed episode was last night. Patient is on a blood thinner. The pharmacist recommended using saline gel (nasal). [name] questions if any additional recommendations can be given.   Please advise on both concerns

## 2020-01-06 ENCOUNTER — Encounter: Payer: Self-pay | Admitting: Nurse Practitioner

## 2020-01-06 NOTE — Telephone Encounter (Signed)
Discuss Kristina Bruce's response with [name] and [name], both verbalized understanding.   Patient has a humidifier in her bedroom already. Patient will call in a week if nosebleeds continue.

## 2020-01-07 ENCOUNTER — Telehealth: Payer: Self-pay | Admitting: Cardiology

## 2020-01-07 NOTE — Telephone Encounter (Signed)
Kristina Bruce called back with the pts BP 144/70, 145/74, 138/63 now HR 66 but she is tired.   She will continue to monitor and she felt she was doing well.   She will call back if she has any further questions.

## 2020-01-07 NOTE — Telephone Encounter (Signed)
Please tell patient to take Eliquis this morning's dose as scheduled.  Skip amiodarone for today and take it back to usual dose tomorrow.  Skip blood pressure medicine today.  Let us know about pulse blood pressure sometime today if possible.  If she feels bad then she needs to go to urgent care a center

## 2020-01-07 NOTE — Telephone Encounter (Signed)
Pt c/o medication issue:  1. Name of Medication: ELIQUIS 2.5 MG TABS tablet amiodarone (PACERONE) 200 MG tablet telmisartan (MICARDIS) 40 MG tablet 2. How are you currently taking this medication (dosage and times per day)? Took 3 tablets of Eliquis yesterday instead of two. Took 2 tablet of amiodarone in stead of 1. Took 3 tablets of telmisartan instead of two.   3. Are you having a reaction (difficulty breathing--STAT)? No   4. What is your medication issue? [place] daughter is calling this morning stating Imberly took double of these three medications yesterday out of error. [name] is wanting to know how she should take these three medications today due to this. Please advise.

## 2020-01-07 NOTE — Telephone Encounter (Signed)
Pts daughter Kristina Bruce advised and verbalized understanding and will call this afternoon with her BP readings... now it is 101/57 and HR 72  She will have her hydrate well and continue to monitor.

## 2020-01-07 NOTE — Telephone Encounter (Signed)
Pt took her last dose of Eliquis 2.5 mg tabs last night at 9pm but she took two and not one.   At 9 pm she also took two Amiodarone 200mg  tabs instead of one.  She also took three Telmisartan 40mg  tabs instead of two.   She fels well. Her daughter is unsure of her BP. She is asking how to proceed with her meds today.   Will need to forward to Dr. Geraldo Pitter for review and recommendations.

## 2020-01-08 ENCOUNTER — Telehealth: Payer: Self-pay | Admitting: Nurse Practitioner

## 2020-01-08 NOTE — Telephone Encounter (Signed)
Daughter notified and agreed.  

## 2020-01-08 NOTE — Telephone Encounter (Signed)
Pt started new med(antidepressant) & its makes her a little groggy. Is it possible to give her half or cut the pill in half (dose) ?  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

## 2020-01-08 NOTE — Telephone Encounter (Signed)
Yes okay to half tablet if needed, generally the grogginess should wear off after a few days if she is able to tolerate it

## 2020-01-15 ENCOUNTER — Ambulatory Visit (INDEPENDENT_AMBULATORY_CARE_PROVIDER_SITE_OTHER): Payer: Medicare Other | Admitting: Adult Health

## 2020-01-15 ENCOUNTER — Other Ambulatory Visit: Payer: Self-pay

## 2020-01-15 ENCOUNTER — Other Ambulatory Visit: Payer: Self-pay | Admitting: Adult Health

## 2020-01-15 VITALS — BP 130/80 | HR 74 | Temp 98.1°F | Ht 63.0 in

## 2020-01-15 DIAGNOSIS — R110 Nausea: Secondary | ICD-10-CM | POA: Diagnosis not present

## 2020-01-15 DIAGNOSIS — E559 Vitamin D deficiency, unspecified: Secondary | ICD-10-CM | POA: Diagnosis not present

## 2020-01-15 DIAGNOSIS — I2510 Atherosclerotic heart disease of native coronary artery without angina pectoris: Secondary | ICD-10-CM | POA: Diagnosis not present

## 2020-01-15 DIAGNOSIS — R059 Cough, unspecified: Secondary | ICD-10-CM

## 2020-01-15 DIAGNOSIS — R040 Epistaxis: Secondary | ICD-10-CM | POA: Diagnosis not present

## 2020-01-15 DIAGNOSIS — R05 Cough: Secondary | ICD-10-CM | POA: Diagnosis not present

## 2020-01-15 DIAGNOSIS — R300 Dysuria: Secondary | ICD-10-CM

## 2020-01-15 DIAGNOSIS — R630 Anorexia: Secondary | ICD-10-CM

## 2020-01-15 LAB — POCT URINALYSIS DIPSTICK
Bilirubin, UA: NEGATIVE
Blood, UA: NEGATIVE
Glucose, UA: NEGATIVE
Leukocytes, UA: NEGATIVE
Nitrite, UA: NEGATIVE
Protein, UA: POSITIVE — AB
Spec Grav, UA: 1.02 (ref 1.010–1.025)
Urobilinogen, UA: 1 E.U./dL
pH, UA: 6 (ref 5.0–8.0)

## 2020-01-15 MED ORDER — ONDANSETRON HCL 4 MG PO TABS: 4.0000 mg | ORAL_TABLET | Freq: Three times a day (TID) | ORAL | 0 refills | Status: AC | PRN

## 2020-01-15 MED ORDER — MULTIVITAMINS/MINERALS ADULT PO LIQD: 1.0000 | Freq: Every day | ORAL | Status: DC

## 2020-01-15 NOTE — Progress Notes (Signed)
[facility]   [facility] clinic  Provider:  Durenda Age - DNP  Code Status:  Has healthcare Power of Attorney  Goals of Care:  Advanced Directives 08/04/2019  Does Patient Have a Medical Advance Directive? No  Type of Advance Directive -     Chief Complaint  Patient presents with   Acute Visit    Patient complains of Nose bleed, nausea, confusion, cough and lack of appetite. Patient has always had these symptoms, but seem to be getting worse. Patient been sleeping more and has some shortness of breath. Has had both COVID vaccines and would like to know about the booster.Patient has lost weight since last office visit,about 3 pounds.    HPI: Patient is a 86 y.o. female seen today for an acute visit for complaints of epistaxis, nausea, confusion, cough and lack of appetite. She has a PMH of hypertension, mixed hyperlipidemia, coronary artery disease and mixed hyperlipidemia. Daughter reported that patient had epistaxis occasionally and the last one was 2 days ago, which lasted for 20 minutes. Latest BP 130/80. She takes Micardis for hypertension. She takes Amiodarone and Metoprolol succinate and Eliquis for atrial fibrillation. Latest  wbc 11.5,  hgb 11.6, platelet 293, GFR 82, Na 133, K 4.5. WBC down from 12.3 (08/02/19) to 11.5 (01/01/20). Patient denies chills, fever nor bodyaches. Daughter reported that she noted that her mother's memory is progressively  getting worse, especially short-term memory. Daughter reported that patient lost 10 lbs in a year. Patient was reported to be nauseated. She lives by herself but daughter and son comes to check on her. Daughter arranges her medications in pill organizer. Daughter stated that she does not know how much her mother eats. She drinks a protein supplement but cannot finish the whole can in a day.   Past Medical History:  Diagnosis Date   Allergic rhinitis    Choroidal nevus of right eye    Per [place] new patient packet   Coronary atherosclerosis of  native coronary artery    Dehydration    Per [place] new patient packet   Essential hypertension 03/03/2019   Essential hypertension, benign    Essential hypertension, benign    H/O mammogram 2020   Per [place] [facility]   Intermediate coronary syndrome ([place])    Loss of appetite    Per [place] new patient packet   Low blood pressure    Per [place] new patient packet   Mixed hyperlipidemia 03/03/2019   Nausea    Per [place] new patient packet   Osteopenia    with Vitamin D Deficiency    Osteoporosis    Per [facility] new patient packet   PAC (premature atrial contraction) 03/03/2019   PAF (paroxysmal atrial fibrillation) ([place]) 07/31/2019   Syncope and collapse 03/03/2019    Past Surgical History:  Procedure Laterality Date   ABDOMINAL HYSTERECTOMY  1970s   total    No Known Allergies  Outpatient Encounter Medications as of 01/15/2020  Medication Sig   Acetaminophen (TYLENOL PO) Take 500 mg by mouth. As needed, not sure of dose    amiodarone (PACERONE) 200 MG tablet Take 100 mg by mouth daily.   Calcium Carbonate-Vitamin D (CALCIUM + D PO) Take by mouth daily.   ELIQUIS 2.5 MG TABS tablet TAKE 1 TABLET TWICE A DAY   ergocalciferol (VITAMIN D2) 1.25 MG (50000 UT) capsule Take by mouth once a week.    fexofenadine (ALLEGRA) 60 MG tablet Take 60 mg by mouth as needed.  metoprolol succinate (TOPROL-XL) 50 MG 24 hr tablet TAKE ONE-HALF (1/2) TABLET (25 MG TOTAL) DAILY . TAKE WITH OR IMMEDIATELY FOLLOWING A MEAL   mirtazapine (REMERON) 7.5 MG tablet Take 1 tablet (7.5 mg total) by mouth at bedtime. (Patient taking differently: Take 7.5 mg by mouth at bedtime. Taking 1/2 tablet)   Nutritional Supplements (NUTRI-DRINK PO) Take by mouth daily. [employer]   telmisartan (MICARDIS) 40 MG tablet Take 1 tablet (40 mg total) by mouth 2 (two) times daily.   No facility-administered encounter medications on file as of 01/15/2020.    Review of Systems:  Review of  Systems  Constitutional: Positive for appetite change and unexpected weight change. Negative for fever.       Poor appetite  HENT: Positive for hearing loss and nosebleeds. Negative for sneezing and sore throat.   Eyes: Negative.  Negative for discharge, redness and itching.  Respiratory: Positive for cough. Negative for chest tightness, shortness of breath and wheezing.   Cardiovascular: Negative for chest pain and leg swelling.  Gastrointestinal: Positive for nausea. Negative for abdominal distention, abdominal pain, constipation, diarrhea and vomiting.  Genitourinary: Negative for difficulty urinating, dysuria and frequency.  Musculoskeletal: Negative.  Negative for gait problem and joint swelling.  Skin: Negative.   Neurological: Negative for dizziness and headaches.  Psychiatric/Behavioral: Negative.     Health Maintenance  Topic Date Due   DEXA SCAN  Never done   PNA vac Low Risk Adult (1 of 2 - PCV13) Never done   TETANUS/TDAP  08/29/2027   INFLUENZA VACCINE  Completed   COVID-19 Vaccine  Completed    Physical Exam: Vitals:   01/15/20 1533  BP: 130/80  Pulse: 74  Temp: 98.1 F (36.7 C)  SpO2: 90%  Height: 5\' 3"  (1.6 m)   Body mass index is 17.86 kg/m. Physical Exam HENT:     Head: Normocephalic and atraumatic.     Nose: Nose normal.     Mouth/Throat:     Mouth: Mucous membranes are moist.  Eyes:     Conjunctiva/sclera: Conjunctivae normal.  Cardiovascular:     Rate and Rhythm: Normal rate and regular rhythm.     Pulses: Normal pulses.     Heart sounds: Normal heart sounds.  Pulmonary:     Effort: Pulmonary effort is normal.     Breath sounds: Normal breath sounds.  Abdominal:     General: Bowel sounds are normal.     Palpations: Abdomen is soft.  Musculoskeletal:        General: No swelling or tenderness. Normal range of motion.     Cervical back: Normal range of motion and neck supple.  Skin:    General: Skin is warm and dry.  Neurological:      Mental Status: She is alert and oriented to person, place, and time. Mental status is at baseline.  Psychiatric:        Mood and Affect: Mood normal.        Behavior: Behavior normal.     Labs reviewed: Basic Metabolic Panel: Recent Labs    07/24/19 1325 08/02/19 2004 08/06/19 1700 11/20/19 1140 01/01/20 1448  NA 134   < > 135 133* 133*  K 4.4   < > 4.3 4.3 4.5  CL 97   < > 100 96 96*  CO2 25   < > 22 25 27   GLUCOSE 94   < > 98 86 88  BUN 9   < > 14 14 17   CREATININE 0.67   < >  0.78 0.72 0.61  CALCIUM 9.5   < > 9.4 9.3 9.3  MG 2.4*  --   --   --   --   TSH 0.995  --   --   --  1.61   < > = values in this interval not displayed.   Liver Function Tests: Recent Labs    08/06/19 1700 08/06/19 1700 08/18/19 1009 11/20/19 1140 01/01/20 1448  AST 40   < > 25 24 16   ALT 47*   < > 21 13 11   ALKPHOS 105  --  83 62  --   BILITOT 0.6   < > 0.4 0.4 0.5  PROT 6.5   < > 6.2 6.8 6.8  ALBUMIN 4.3  --  4.0 4.2  --    < > = values in this interval not displayed.   CBC: Recent Labs    08/02/19 2004 08/05/19 0024 01/01/20 1448  WBC 13.0* 12.3* 11.5*  NEUTROABS 9.6* 9.0* 8,706*  HGB 13.7 13.8 11.6*  HCT 42.5 43.2 34.6*  MCV 89.7 90.6 91.3  PLT 281 284 293     Assessment/Plan  1. Cough - SARS-COV-2 RNA,(COVID-19) QUAL NAAT  2. Nausea - ondansetron (ZOFRAN) 4 MG tablet; Take 1 tablet (4 mg total) by mouth 3 (three) times daily as needed for nausea or vomiting.  Dispense: 20 tablet; Refill: 0 - SARS-COV-2 RNA,(COVID-19) QUAL NAAT  3. Vitamin D deficiency - continue Vitamin D2 5,000 units 1 capsule weekly - Vitamin D, 25-OH,Total,IA(Refl)  4. Dysuria - dipstick was negative for infection - POC Urinalysis Dipstick  5. Decreased appetite - continue Remeron 7.5 mg at bedtime - Multiple Vitamins-Iron (QC DAILY MULTIVITAMINS/IRON) TABS; Take 1 tablet by mouth daily.  Dispense: 30 tablet; Refill: 3  6. Epistaxis - continue Saline nasal spray PRN - discussed Afrin  nasal spray PRN but cautioned regarding side effect of hypertension     Labs/tests ordered:  Vitamin D level, COVID-19 test,   Next appt:  Visit date not found

## 2020-01-15 NOTE — Patient Instructions (Signed)
Nausea, Adult Nausea is feeling sick to your stomach or feeling that you are about to throw up (vomit). Feeling sick to your stomach is usually not serious, but it may be an early sign of a more serious medical problem. As you feel sicker to your stomach, you may throw up. If you throw up, or if you are not able to drink enough fluids, there is a risk that you may lose too much water in your body (get dehydrated). If you lose too much water in your body, you may:  Feel tired.  Feel thirsty.  Have a dry mouth.  Have cracked lips.  Go pee (urinate) less often. Older adults and people who have other diseases or a weak body defense system (immune system) have a higher risk of losing too much water in the body. The main goals of treating this condition are:  To relieve your nausea.  To ensure your nausea occurs less often.  To prevent throwing up and losing too much fluid. Follow these instructions at home: Watch your symptoms for any changes. Tell your doctor about them. Follow these instructions as told by your doctor. Eating and drinking      Take an ORS (oral rehydration solution). This is a drink that is sold at pharmacies and stores.  Drink clear fluids in small amounts as you are able. These include: ? Water. ? Ice chips. ? Fruit juice that has water added (diluted fruit juice). ? Low-calorie sports drinks.  Eat bland, easy-to-digest foods in small amounts as you are able, such as: ? Bananas. ? Applesauce. ? Rice. ? Low-fat (lean) meats. ? Toast. ? Crackers.  Avoid drinking fluids that have a lot of sugar or caffeine in them. This includes energy drinks, sports drinks, and soda.  Avoid alcohol.  Avoid spicy or fatty foods. General instructions  Take over-the-counter and prescription medicines only as told by your doctor.  Rest at home while you get better.  Drink enough fluid to keep your pee (urine) pale yellow.  Take slow and deep breaths when you feel  sick to your stomach.  Avoid food or things that have strong smells.  Wash your hands often with soap and water. If you cannot use soap and water, use hand sanitizer.  Make sure that all people in your home wash their hands well and often.  Keep all follow-up visits as told by your doctor. This is important. Contact a doctor if:  You feel sicker to your stomach.  You feel sick to your stomach for more than 2 days.  You throw up.  You are not able to drink fluids without throwing up.  You have new symptoms.  You have a fever.  You have a headache.  You have muscle cramps.  You have a rash.  You have pain while peeing.  You feel light-headed or dizzy. Get help right away if:  You have pain in your chest, neck, arm, or jaw.  You feel very weak or you pass out (faint).  You have throw up that is bright red or looks like coffee grounds.  You have bloody or black poop (stools) or poop that looks like tar.  You have a very bad headache, a stiff neck, or both.  You have very bad pain, cramping, or bloating in your belly (abdomen).  You have trouble breathing or you are breathing very quickly.  Your heart is beating very quickly.  Your skin feels cold and clammy.  You feel confused.    You have signs of losing too much water in your body, such as: ? Dark pee, very little pee, or no pee. ? Cracked lips. ? Dry mouth. ? Sunken eyes. ? Sleepiness. ? Weakness. These symptoms may be an emergency. Do not wait to see if the symptoms will go away. Get medical help right away. Call your local emergency services (911 in the [place] Do not drive yourself to the hospital. Summary  Nausea is feeling sick to your stomach or feeling that you are about to throw up (vomit).  If you throw up, or if you are not able to drink enough fluids, there is a risk that you may lose too much water in your body (get dehydrated).  Eat and drink what your doctor tells you. Take  over-the-counter and prescription medicines only as told by your doctor.  Contact a doctor right away if your symptoms get worse or you have new symptoms.  Keep all follow-up visits as told by your doctor. This is important. This information is not intended to replace advice given to you by your health care provider. Make sure you discuss any questions you have with your health care provider. Document Revised: 09/17/2017 Document Reviewed: 09/17/2017 Elsevier Patient Education  2020 [employer]  

## 2020-01-16 LAB — SARS-COV-2 RNA,(COVID-19) QUALITATIVE NAAT: SARS CoV2 RNA: NOT DETECTED

## 2020-01-16 LAB — VITAMIN D 25 HYDROXY (VIT D DEFICIENCY, FRACTURES): Vit D, 25-Hydroxy: 114 ng/mL — ABNORMAL HIGH (ref 30–100)

## 2020-01-16 MED ORDER — QC DAILY MULTIVITAMINS/IRON PO TABS: 1.0000 | ORAL_TABLET | Freq: Every day | ORAL | 3 refills | Status: DC

## 2020-01-16 NOTE — Progress Notes (Signed)
Vitamin D level is within normal.

## 2020-01-17 NOTE — Progress Notes (Signed)
Result is negative for COVID-19.

## 2020-01-18 ENCOUNTER — Emergency Department (HOSPITAL_COMMUNITY): Payer: Medicare Other

## 2020-01-18 ENCOUNTER — Inpatient Hospital Stay (HOSPITAL_COMMUNITY)
Admission: EM | Admit: 2020-01-18 | Discharge: 2020-01-22 | DRG: 193 | Disposition: E | Payer: Medicare Other | Attending: Internal Medicine | Admitting: Internal Medicine

## 2020-01-18 ENCOUNTER — Other Ambulatory Visit: Payer: Self-pay

## 2020-01-18 ENCOUNTER — Encounter (HOSPITAL_COMMUNITY): Payer: Self-pay

## 2020-01-18 ENCOUNTER — Telehealth: Payer: Self-pay

## 2020-01-18 ENCOUNTER — Inpatient Hospital Stay (HOSPITAL_COMMUNITY): Payer: Medicare Other

## 2020-01-18 DIAGNOSIS — Z7901 Long term (current) use of anticoagulants: Secondary | ICD-10-CM | POA: Diagnosis not present

## 2020-01-18 DIAGNOSIS — Y92230 Patient room in hospital as the place of occurrence of the external cause: Secondary | ICD-10-CM | POA: Diagnosis not present

## 2020-01-18 DIAGNOSIS — E871 Hypo-osmolality and hyponatremia: Secondary | ICD-10-CM | POA: Diagnosis present

## 2020-01-18 DIAGNOSIS — R0902 Hypoxemia: Secondary | ICD-10-CM

## 2020-01-18 DIAGNOSIS — J309 Allergic rhinitis, unspecified: Secondary | ICD-10-CM | POA: Diagnosis present

## 2020-01-18 DIAGNOSIS — Z87891 Personal history of nicotine dependence: Secondary | ICD-10-CM

## 2020-01-18 DIAGNOSIS — G9341 Metabolic encephalopathy: Secondary | ICD-10-CM | POA: Diagnosis present

## 2020-01-18 DIAGNOSIS — I110 Hypertensive heart disease with heart failure: Secondary | ICD-10-CM | POA: Diagnosis present

## 2020-01-18 DIAGNOSIS — E860 Dehydration: Secondary | ICD-10-CM | POA: Diagnosis present

## 2020-01-18 DIAGNOSIS — R627 Adult failure to thrive: Secondary | ICD-10-CM | POA: Diagnosis present

## 2020-01-18 DIAGNOSIS — J9621 Acute and chronic respiratory failure with hypoxia: Secondary | ICD-10-CM | POA: Diagnosis present

## 2020-01-18 DIAGNOSIS — J189 Pneumonia, unspecified organism: Principal | ICD-10-CM

## 2020-01-18 DIAGNOSIS — R7989 Other specified abnormal findings of blood chemistry: Secondary | ICD-10-CM

## 2020-01-18 DIAGNOSIS — M810 Age-related osteoporosis without current pathological fracture: Secondary | ICD-10-CM | POA: Diagnosis present

## 2020-01-18 DIAGNOSIS — Z20822 Contact with and (suspected) exposure to covid-19: Secondary | ICD-10-CM | POA: Diagnosis present

## 2020-01-18 DIAGNOSIS — Z66 Do not resuscitate: Secondary | ICD-10-CM | POA: Diagnosis present

## 2020-01-18 DIAGNOSIS — I480 Paroxysmal atrial fibrillation: Secondary | ICD-10-CM | POA: Diagnosis present

## 2020-01-18 DIAGNOSIS — Z681 Body mass index (BMI) 19 or less, adult: Secondary | ICD-10-CM

## 2020-01-18 DIAGNOSIS — W19XXXA Unspecified fall, initial encounter: Secondary | ICD-10-CM

## 2020-01-18 DIAGNOSIS — E872 Acidosis: Secondary | ICD-10-CM | POA: Diagnosis present

## 2020-01-18 DIAGNOSIS — E43 Unspecified severe protein-calorie malnutrition: Secondary | ICD-10-CM | POA: Diagnosis present

## 2020-01-18 DIAGNOSIS — Z90710 Acquired absence of both cervix and uterus: Secondary | ICD-10-CM | POA: Diagnosis not present

## 2020-01-18 DIAGNOSIS — D638 Anemia in other chronic diseases classified elsewhere: Secondary | ICD-10-CM | POA: Diagnosis present

## 2020-01-18 DIAGNOSIS — I351 Nonrheumatic aortic (valve) insufficiency: Secondary | ICD-10-CM | POA: Diagnosis not present

## 2020-01-18 DIAGNOSIS — Z515 Encounter for palliative care: Secondary | ICD-10-CM | POA: Diagnosis not present

## 2020-01-18 DIAGNOSIS — W06XXXA Fall from bed, initial encounter: Secondary | ICD-10-CM | POA: Diagnosis not present

## 2020-01-18 DIAGNOSIS — D72829 Elevated white blood cell count, unspecified: Secondary | ICD-10-CM

## 2020-01-18 DIAGNOSIS — J9602 Acute respiratory failure with hypercapnia: Secondary | ICD-10-CM

## 2020-01-18 DIAGNOSIS — I2510 Atherosclerotic heart disease of native coronary artery without angina pectoris: Secondary | ICD-10-CM | POA: Diagnosis present

## 2020-01-18 DIAGNOSIS — J9601 Acute respiratory failure with hypoxia: Secondary | ICD-10-CM | POA: Diagnosis present

## 2020-01-18 DIAGNOSIS — E782 Mixed hyperlipidemia: Secondary | ICD-10-CM | POA: Diagnosis present

## 2020-01-18 DIAGNOSIS — I5032 Chronic diastolic (congestive) heart failure: Secondary | ICD-10-CM | POA: Diagnosis present

## 2020-01-18 DIAGNOSIS — I361 Nonrheumatic tricuspid (valve) insufficiency: Secondary | ICD-10-CM | POA: Diagnosis not present

## 2020-01-18 DIAGNOSIS — D649 Anemia, unspecified: Secondary | ICD-10-CM

## 2020-01-18 LAB — CBC WITH DIFFERENTIAL/PLATELET
Abs Immature Granulocytes: 0.31 10*3/uL — ABNORMAL HIGH (ref 0.00–0.07)
Basophils Absolute: 0 10*3/uL (ref 0.0–0.1)
Basophils Relative: 0 %
Eosinophils Absolute: 0.2 10*3/uL (ref 0.0–0.5)
Eosinophils Relative: 1 %
HCT: 31.4 % — ABNORMAL LOW (ref 36.0–46.0)
Hemoglobin: 10.3 g/dL — ABNORMAL LOW (ref 12.0–15.0)
Immature Granulocytes: 1 %
Lymphocytes Relative: 4 %
Lymphs Abs: 0.9 10*3/uL (ref 0.7–4.0)
MCH: 29.9 pg (ref 26.0–34.0)
MCHC: 32.8 g/dL (ref 30.0–36.0)
MCV: 91.3 fL (ref 80.0–100.0)
Monocytes Absolute: 1.3 10*3/uL — ABNORMAL HIGH (ref 0.1–1.0)
Monocytes Relative: 6 %
Neutro Abs: 19 10*3/uL — ABNORMAL HIGH (ref 1.7–7.7)
Neutrophils Relative %: 88 %
Platelets: 473 10*3/uL — ABNORMAL HIGH (ref 150–400)
RBC: 3.44 MIL/uL — ABNORMAL LOW (ref 3.87–5.11)
RDW: 13.7 % (ref 11.5–15.5)
WBC: 21.7 10*3/uL — ABNORMAL HIGH (ref 4.0–10.5)
nRBC: 0 % (ref 0.0–0.2)

## 2020-01-18 LAB — COMPREHENSIVE METABOLIC PANEL
ALT: 34 U/L (ref 0–44)
AST: 42 U/L — ABNORMAL HIGH (ref 15–41)
Albumin: 1.8 g/dL — ABNORMAL LOW (ref 3.5–5.0)
Alkaline Phosphatase: 97 U/L (ref 38–126)
Anion gap: 12 (ref 5–15)
BUN: 13 mg/dL (ref 8–23)
CO2: 21 mmol/L — ABNORMAL LOW (ref 22–32)
Calcium: 7.8 mg/dL — ABNORMAL LOW (ref 8.9–10.3)
Chloride: 95 mmol/L — ABNORMAL LOW (ref 98–111)
Creatinine, Ser: 0.77 mg/dL (ref 0.44–1.00)
GFR calc Af Amer: >60 mL/min (ref >60)
GFR calc non Af Amer: >60 mL/min (ref >60)
Glucose, Bld: 152 mg/dL — ABNORMAL HIGH (ref 70–99)
Potassium: 4 mmol/L (ref 3.5–5.1)
Sodium: 128 mmol/L — ABNORMAL LOW (ref 135–145)
Total Bilirubin: 0.9 mg/dL (ref 0.3–1.2)
Total Protein: 5.6 g/dL — ABNORMAL LOW (ref 6.5–8.1)

## 2020-01-18 LAB — I-STAT ARTERIAL BLOOD GAS, ED
Acid-Base Excess: 0 mmol/L (ref 0.0–2.0)
Bicarbonate: 24 mmol/L (ref 20.0–28.0)
Calcium, Ion: 1.13 mmol/L — ABNORMAL LOW (ref 1.15–1.40)
HCT: 28 % — ABNORMAL LOW (ref 36.0–46.0)
Hemoglobin: 9.5 g/dL — ABNORMAL LOW (ref 12.0–15.0)
O2 Saturation: 93 %
Patient temperature: 98.7
Potassium: 3.9 mmol/L (ref 3.5–5.1)
Sodium: 129 mmol/L — ABNORMAL LOW (ref 135–145)
TCO2: 25 mmol/L (ref 22–32)
pCO2 arterial: 34.4 mmHg (ref 32.0–48.0)
pH, Arterial: 7.453 — ABNORMAL HIGH (ref 7.350–7.450)
pO2, Arterial: 64 mmHg — ABNORMAL LOW (ref 83.0–108.0)

## 2020-01-18 LAB — URINALYSIS, ROUTINE W REFLEX MICROSCOPIC
Bacteria, UA: NONE SEEN
Bilirubin Urine: NEGATIVE
Glucose, UA: NEGATIVE mg/dL
Hgb urine dipstick: NEGATIVE
Ketones, ur: NEGATIVE mg/dL
Leukocytes,Ua: NEGATIVE
Nitrite: NEGATIVE
Protein, ur: 30 mg/dL — AB
Specific Gravity, Urine: 1.02 (ref 1.005–1.030)
pH: 5 (ref 5.0–8.0)

## 2020-01-18 LAB — I-STAT VENOUS BLOOD GAS, ED
Acid-Base Excess: 1 mmol/L (ref 0.0–2.0)
Bicarbonate: 23.9 mmol/L (ref 20.0–28.0)
Calcium, Ion: 1.01 mmol/L — ABNORMAL LOW (ref 1.15–1.40)
HCT: 31 % — ABNORMAL LOW (ref 36.0–46.0)
Hemoglobin: 10.5 g/dL — ABNORMAL LOW (ref 12.0–15.0)
O2 Saturation: 99 %
Potassium: 4.1 mmol/L (ref 3.5–5.1)
Sodium: 127 mmol/L — ABNORMAL LOW (ref 135–145)
TCO2: 25 mmol/L (ref 22–32)
pCO2, Ven: 31 mmHg — ABNORMAL LOW (ref 44.0–60.0)
pH, Ven: 7.494 — ABNORMAL HIGH (ref 7.250–7.430)
pO2, Ven: 148 mmHg — ABNORMAL HIGH (ref 32.0–45.0)

## 2020-01-18 LAB — LACTIC ACID, PLASMA
Lactic Acid, Venous: 3.1 mmol/L (ref 0.5–1.9)
Lactic Acid, Venous: 2.1 mmol/L (ref 0.5–1.9)

## 2020-01-18 LAB — OSMOLALITY, URINE: Osmolality, Ur: 666 mOsm/kg (ref 300–900)

## 2020-01-18 LAB — TROPONIN I (HIGH SENSITIVITY)
Troponin I (High Sensitivity): 34 ng/L — ABNORMAL HIGH (ref <18)
Troponin I (High Sensitivity): 48 ng/L — ABNORMAL HIGH (ref <18)

## 2020-01-18 LAB — BRAIN NATRIURETIC PEPTIDE: B Natriuretic Peptide: 209.5 pg/mL — ABNORMAL HIGH (ref 0.0–100.0)

## 2020-01-18 LAB — RESPIRATORY PANEL BY RT PCR (FLU A&B, COVID)
Influenza A by PCR: NEGATIVE
Influenza B by PCR: NEGATIVE
SARS Coronavirus 2 by RT PCR: NEGATIVE

## 2020-01-18 LAB — OSMOLALITY: Osmolality: 270 mOsm/kg — ABNORMAL LOW (ref 275–295)

## 2020-01-18 LAB — SODIUM, URINE, RANDOM: Sodium, Ur: <10 mmol/L

## 2020-01-18 LAB — D-DIMER, QUANTITATIVE: D-Dimer, Quant: 2.91 ug/mL-FEU — ABNORMAL HIGH (ref 0.00–0.50)

## 2020-01-18 MED ORDER — ACETAMINOPHEN 650 MG RE SUPP: 650.0000 mg | Freq: Four times a day (QID) | RECTAL | Status: DC | PRN

## 2020-01-18 MED ORDER — APIXABAN 2.5 MG PO TABS
2.5000 mg | ORAL_TABLET | Freq: Two times a day (BID) | ORAL | Status: DC
  Administered 2020-01-18 – 2020-01-20 (×3): 2.5 mg via ORAL
  Filled 2020-01-18 (×5): qty 1

## 2020-01-18 MED ORDER — MIRTAZAPINE 7.5 MG PO TABS
7.5000 mg | ORAL_TABLET | Freq: Every day | ORAL | Status: DC
  Administered 2020-01-18: 7.5 mg via ORAL
  Filled 2020-01-18 (×3): qty 1

## 2020-01-18 MED ORDER — SODIUM CHLORIDE 0.9 % IV SOLN
1.0000 g | Freq: Once | INTRAVENOUS | Status: AC
  Administered 2020-01-18: 1 g via INTRAVENOUS
  Filled 2020-01-18: qty 10

## 2020-01-18 MED ORDER — IRBESARTAN 150 MG PO TABS
150.0000 mg | ORAL_TABLET | Freq: Every day | ORAL | Status: DC
  Administered 2020-01-19 – 2020-01-20 (×2): 150 mg via ORAL
  Filled 2020-01-18 (×2): qty 1

## 2020-01-18 MED ORDER — ONDANSETRON HCL 4 MG PO TABS: 4.0000 mg | ORAL_TABLET | Freq: Three times a day (TID) | ORAL | Status: DC | PRN

## 2020-01-18 MED ORDER — ACETAMINOPHEN 325 MG PO TABS: 650.0000 mg | ORAL_TABLET | Freq: Four times a day (QID) | ORAL | Status: DC | PRN

## 2020-01-18 MED ORDER — LORATADINE 10 MG PO TABS
10.0000 mg | ORAL_TABLET | Freq: Every day | ORAL | Status: DC
  Administered 2020-01-20: 10 mg via ORAL
  Filled 2020-01-18 (×2): qty 1

## 2020-01-18 MED ORDER — IPRATROPIUM BROMIDE 0.02 % IN SOLN: 0.5000 mg | Freq: Four times a day (QID) | RESPIRATORY_TRACT | Status: DC | PRN

## 2020-01-18 MED ORDER — SODIUM CHLORIDE 0.9 % IV SOLN
500.0000 mg | Freq: Once | INTRAVENOUS | Status: AC
  Administered 2020-01-18: 500 mg via INTRAVENOUS
  Filled 2020-01-18: qty 500

## 2020-01-18 MED ORDER — METOPROLOL SUCCINATE ER 50 MG PO TB24
50.0000 mg | ORAL_TABLET | Freq: Every day | ORAL | Status: DC
  Administered 2020-01-19 – 2020-01-20 (×2): 50 mg via ORAL
  Filled 2020-01-18: qty 1
  Filled 2020-01-18: qty 2

## 2020-01-18 MED ORDER — AZITHROMYCIN 250 MG PO TABS: 500.0000 mg | ORAL_TABLET | Freq: Every day | ORAL | Status: DC

## 2020-01-18 MED ORDER — IPRATROPIUM BROMIDE 0.02 % IN SOLN
0.5000 mg | Freq: Four times a day (QID) | RESPIRATORY_TRACT | Status: DC
  Administered 2020-01-18 – 2020-01-20 (×5): 0.5 mg via RESPIRATORY_TRACT
  Filled 2020-01-18 (×5): qty 2.5

## 2020-01-18 MED ORDER — CALCIUM-VITAMIN D-VITAMIN K 500-1000-40 MG-UNT-MCG PO CHEW: CHEWABLE_TABLET | Freq: Every day | ORAL | Status: DC

## 2020-01-18 MED ORDER — CALCIUM CARBONATE-VITAMIN D 500-200 MG-UNIT PO TABS
1.0000 | ORAL_TABLET | Freq: Every day | ORAL | Status: DC
  Administered 2020-01-19 – 2020-01-20 (×2): 1 via ORAL
  Filled 2020-01-18 (×2): qty 1

## 2020-01-18 MED ORDER — ENSURE ENLIVE PO LIQD
237.0000 mL | Freq: Two times a day (BID) | ORAL | Status: DC
  Administered 2020-01-20: 237 mL via ORAL
  Filled 2020-01-18: qty 237

## 2020-01-18 MED ORDER — LACTATED RINGERS IV BOLUS
1000.0000 mL | Freq: Once | INTRAVENOUS | Status: AC
  Administered 2020-01-18: 1000 mL via INTRAVENOUS

## 2020-01-18 MED ORDER — GUAIFENESIN ER 600 MG PO TB12
600.0000 mg | ORAL_TABLET | Freq: Two times a day (BID) | ORAL | Status: DC
  Administered 2020-01-18 – 2020-01-20 (×2): 600 mg via ORAL
  Filled 2020-01-18 (×4): qty 1

## 2020-01-18 MED ORDER — CEFTRIAXONE SODIUM 1 G IJ SOLR
1.0000 g | INTRAMUSCULAR | Status: DC
Start: 2020-01-19 — End: 2020-01-18

## 2020-01-18 NOTE — ED Triage Notes (Signed)
Pt arrived via EMS from home family reporting sob, lethargy, and ams for the last week worsening this past weekend. Pts oxygen was at 50% on EMS arrival.

## 2020-01-18 NOTE — ED Notes (Signed)
Pt sating 84% on 15L HFNC with a good waveform. Pt placed on 15L NRB as well. Pt now sating 90-91%. Will continue to monitor pt.

## 2020-01-18 NOTE — Progress Notes (Signed)
Full consult note in AM  Elderly woman presenting with insidious onset worsening SOB.  Also memory issues, intermittent nausea.  Started on amiodarone over the summer for difficult to control afib.  Imaging shows NSIP pattern (multifocal pneumonitis with GGO).  WBC elevated.  No fever.  Differential includes recurrent aspiration, amiodarone toxicity, or a rheumatologic cause.  Agree with SLP consult, checking Pct, holding abx. Will check AM rheum labs, sputum culture if she can produce any  Call if any questions overnight.  Erskine Emery MD PCCM

## 2020-01-18 NOTE — ED Notes (Signed)
Patient transported to CT 

## 2020-01-18 NOTE — Telephone Encounter (Signed)
I called Kristina Bruce to discuss patients recent lab results.Kristina Bruce mentioned that she has observed a significant change in her mothers mental and physical.  Kristina Bruce states Monina Medina-Vargas mentioned that her mother has memory loss, yet no treatment plan was provided for patient was being seen acutely for other issues.  [place]ales states patient needs to see a neurologist to further discuss. I informed Kristina Bruce that her mother would need an in office or video visit.  Kristina Bruce states she will collaborate with her bother and call back to schedule an appointment.  Janett Billow had overheard this conversation and is aware of this interaction.

## 2020-01-18 NOTE — ED Provider Notes (Signed)
[place] EMERGENCY DEPARTMENT Provider Note   CSN: [id number] Arrival date & time: 01/10/2020  1427     History Chief Complaint  Patient presents with  . Shortness of Breath    Kristina Bruce is a 86 y.o. female.  HPI      Kristina Bruce is a 86 y.o. female, with a history of HTN, hyperlipidemia, atrial fib, presenting to the ED with shortness of breath over the last several days.  Per patient's daughter, patient has had generalized weakness, increased confusion, shortness of breath increased over the last few days.  Additionally, she has had intermittent shortness of breath and episodes of weakness, along with poor oral intake over the last several months.  EMS reports they were called to the home for near syncope.  SPO2 upon their arrival 50%, increased to 87% on nonrebreather at 15 L. She has had Covid vaccination. Patient denies fever, chest pain, cough, N/V/D, abdominal pain, urinary symptoms, syncope, or any other complaints.   Past Medical History:  Diagnosis Date  . Allergic rhinitis   . Choroidal nevus of right eye    Per [place] new patient packet  . Coronary atherosclerosis of native coronary artery   . Dehydration    Per [place] new patient packet  . Essential hypertension 03/03/2019  . Essential hypertension, benign   . Essential hypertension, benign   . H/O mammogram 2020   Per [facility] new pateint [facility]  . Intermediate coronary syndrome ([place])   . Loss of appetite    Per [place] new patient packet  . Low blood pressure    Per [facility] new patient packet  . Mixed hyperlipidemia 03/03/2019  . Nausea    Per [place] new patient packet  . Osteopenia    with Vitamin D Deficiency   . Osteoporosis    Per [place] new patient packet  . PAC (premature atrial contraction) 03/03/2019  . PAF (paroxysmal atrial fibrillation) ([place]) 07/31/2019  . Syncope and collapse 03/03/2019    Patient Active Problem List   Diagnosis Date Noted  . Hypoxia 12/31/2019    . PNA (pneumonia) 12/23/2019  . PAF (paroxysmal atrial fibrillation) ([place]) 07/31/2019  . Chest pain 07/24/2019  . Syncope and collapse 03/03/2019  . Essential hypertension 03/03/2019  . Mixed hyperlipidemia 03/03/2019  . PAC (premature atrial contraction) 03/03/2019  . Coronary atherosclerosis of native coronary artery   . Intermediate coronary syndrome [facility])     Past Surgical History:  Procedure Laterality Date  . ABDOMINAL HYSTERECTOMY  1970s   total     OB History    Gravida  2   Para  2   Term      Preterm      AB      Living  2     SAB      TAB      Ectopic      Multiple      Live Births              Family History  Problem Relation Age of Onset  . Breast cancer Mother   . Heart attack Son        stents  . Heart disease Son     Social History   Tobacco Use  . Smoking status: Former Smoker    Years: 16.00  . Smokeless tobacco: Never Used  . Tobacco comment: Quit 1970  Vaping Use  . Vaping Use: Never used  Substance Use Topics  . Alcohol  use: No  . Drug use: No    Home Medications Prior to Admission medications   Medication Sig Start Date End Date Taking? Authorizing Provider  acetaminophen (TYLENOL) 500 MG tablet Take 500 mg by mouth every 6 (six) hours as needed for moderate pain.   Yes [provider]  amiodarone (PACERONE) 200 MG tablet Take 100 mg by mouth every evening.    Yes [provider]  ELIQUIS 2.5 MG TABS tablet TAKE 1 TABLET TWICE A DAY 12/31/19  Yes Skeet Latch, MD  ergocalciferol (VITAMIN D2) 1.25 MG (50000 UT) capsule Take 50,000 Units by mouth once a week.    Yes [provider]  metoprolol succinate (TOPROL-XL) 50 MG 24 hr tablet TAKE ONE-HALF (1/2) TABLET (25 MG TOTAL) DAILY . TAKE WITH OR IMMEDIATELY FOLLOWING A MEAL Patient taking differently: Take 25 mg by mouth daily.  01/04/20  Yes Revankar, Reita Cliche, MD  ondansetron (ZOFRAN) 4 MG tablet Take 1 tablet (4 mg total) by mouth 3  (three) times daily as needed for nausea or vomiting. 01/15/20  Yes Medina-Vargas, Monina C, NP  telmisartan (MICARDIS) 40 MG tablet Take 1 tablet (40 mg total) by mouth 2 (two) times daily. 10/28/19  Yes Tobb, Kardie, DO    Allergies    Patient has no known allergies.  Review of Systems   Review of Systems  Constitutional: Negative for chills, diaphoresis and fever.  Respiratory: Positive for shortness of breath. Negative for cough.   Cardiovascular: Negative for chest pain and leg swelling.  Gastrointestinal: Negative for abdominal pain, diarrhea, nausea and vomiting.  Neurological: Positive for weakness (generalized) and light-headedness.  All other systems reviewed and are negative.   Physical Exam Updated Vital Signs BP (!) 136/58   Pulse 96   Temp 100.3 F (37.9 C) (Oral)   Resp (!) 22   SpO2 93%   Physical Exam Vitals and nursing note reviewed.  Constitutional:      General: She is not in acute distress.    Appearance: She is well-developed. She is not diaphoretic.  HENT:     Head: Normocephalic and atraumatic.     Mouth/Throat:     Mouth: Mucous membranes are moist.     Pharynx: Oropharynx is clear.  Eyes:     Conjunctiva/sclera: Conjunctivae normal.  Cardiovascular:     Rate and Rhythm: Normal rate and regular rhythm.     Pulses: Normal pulses.          Radial pulses are 2+ on the right side and 2+ on the left side.       Posterior tibial pulses are 2+ on the right side and 2+ on the left side.     Heart sounds: Normal heart sounds.     Comments: Tactile temperature in the extremities appropriate and equal bilaterally. Pulmonary:     Effort: Pulmonary effort is normal. No respiratory distress.     Breath sounds: Normal breath sounds.     Comments: SPO2 87% on 15 L supplemental O2 via nonrebreather. Initially requiring high flow nasal cannula to maintain SPO2 above 90%. Abdominal:     Palpations: Abdomen is soft.     Tenderness: There is no abdominal  tenderness. There is no guarding.  Musculoskeletal:     Cervical back: Neck supple.     Right lower leg: No edema.     Left lower leg: No edema.  Lymphadenopathy:     Cervical: No cervical adenopathy.  Skin:    General: Skin is warm  and dry.  Neurological:     Mental Status: She is alert.  Psychiatric:        Mood and Affect: Mood and affect normal.        Speech: Speech normal.        Behavior: Behavior normal.     ED Results / Procedures / Treatments   Labs (all labs ordered are listed, but only abnormal results are displayed) Labs Reviewed  BRAIN NATRIURETIC PEPTIDE - Abnormal; Notable for the following components:      Result Value   B Natriuretic Peptide 209.5 (*)    All other components within normal limits  COMPREHENSIVE METABOLIC PANEL - Abnormal; Notable for the following components:   Sodium 128 (*)    Chloride 95 (*)    CO2 21 (*)    Glucose, Bld 152 (*)    Calcium 7.8 (*)    Total Protein 5.6 (*)    Albumin 1.8 (*)    AST 42 (*)    All other components within normal limits  LACTIC ACID, PLASMA - Abnormal; Notable for the following components:   Lactic Acid, Venous 3.1 (*)    All other components within normal limits  LACTIC ACID, PLASMA - Abnormal; Notable for the following components:   Lactic Acid, Venous 2.1 (*)    All other components within normal limits  CBC WITH DIFFERENTIAL/PLATELET - Abnormal; Notable for the following components:   WBC 21.7 (*)    RBC 3.44 (*)    Hemoglobin 10.3 (*)    HCT 31.4 (*)    Platelets 473 (*)    Neutro Abs 19.0 (*)    Monocytes Absolute 1.3 (*)    Abs Immature Granulocytes 0.31 (*)    All other components within normal limits  D-DIMER, QUANTITATIVE (NOT AT [facility]) - Abnormal; Notable for the following components:   D-Dimer, Quant 2.91 (*)    All other components within normal limits  I-STAT VENOUS BLOOD GAS, ED - Abnormal; Notable for the following components:   pH, Ven 7.494 (*)    pCO2, Ven 31.0 (*)    pO2, Ven  148.0 (*)    Sodium 127 (*)    Calcium, Ion 1.01 (*)    HCT 31.0 (*)    Hemoglobin 10.5 (*)    All other components within normal limits  I-STAT ARTERIAL BLOOD GAS, ED - Abnormal; Notable for the following components:   pH, Arterial 7.453 (*)    pO2, Arterial 64 (*)    Sodium 129 (*)    Calcium, Ion 1.13 (*)    HCT 28.0 (*)    Hemoglobin 9.5 (*)    All other components within normal limits  TROPONIN I (HIGH SENSITIVITY) - Abnormal; Notable for the following components:   Troponin I (High Sensitivity) 34 (*)    All other components within normal limits  TROPONIN I (HIGH SENSITIVITY) - Abnormal; Notable for the following components:   Troponin I (High Sensitivity) 48 (*)    All other components within normal limits  RESPIRATORY PANEL BY RT PCR (FLU A&B, COVID)  URINE CULTURE  CULTURE, BLOOD (ROUTINE X 2)  CULTURE, BLOOD (ROUTINE X 2)  EXPECTORATED SPUTUM ASSESSMENT W REFEX TO RESP CULTURE  URINALYSIS, ROUTINE W REFLEX MICROSCOPIC  BASIC METABOLIC PANEL  CBC  QUANTIFERON-TB GOLD PLUS  PROCALCITONIN  PROCALCITONIN  LEGIONELLA PNEUMOPHILA SEROGP 1 UR AG  MYCOPLASMA PNEUMONIAE ANTIBODY, IGM  STREP PNEUMONIAE URINARY ANTIGEN  SODIUM, URINE, RANDOM  OSMOLALITY  OSMOLALITY, URINE  SEDIMENTATION RATE  C-REACTIVE PROTEIN  RHEUMATOID FACTOR  ANA W/REFLEX IF POSITIVE  PREALBUMIN    EKG EKG Interpretation  Date/Time:  Monday January 18 2020 14:57:22 EDT Ventricular Rate:  87 PR Interval:    QRS Duration: 104 QT Interval:  367 QTC Calculation: 442 R Axis:   93 Text Interpretation: Sinus rhythm Right axis deviation Consider anterior infarct Confirmed by Davonna Belling [phone]) on 01/01/2020 8:58:19 PM   Radiology  DG Chest Portable 1 View  Result Date: 01/03/2020 CLINICAL DATA:  Shortness of breath and lethargy. EXAM: PORTABLE CHEST 1 VIEW COMPARISON:  August 05, 2019 FINDINGS: The lungs are hyperinflated. Marked severity bilateral infiltrates are seen. This involves  most of the right lung, the mid left lung and the left lung base. A small left pleural effusion is noted. No pneumothorax is identified. The heart size and mediastinal contours are within normal limits. The visualized skeletal structures are unremarkable. IMPRESSION: 1. Marked severity bilateral infiltrates. 2. Small left pleural effusion. Electronically Signed   By: Virgina Norfolk M.D.   On: 01/21/2020 15:36    Procedures .Critical Care Performed by: Lorayne Bender, PA-C Authorized by: Lorayne Bender, PA-C   Critical care provider statement:    Critical care time (minutes):  35   Critical care time was exclusive of:  Separately billable procedures and treating other patients   Critical care was necessary to treat or prevent imminent or life-threatening deterioration of the following conditions:  Respiratory failure   Critical care was time spent personally by me on the following activities:  Ordering and performing treatments and interventions, ordering and review of laboratory studies, ordering and review of radiographic studies, pulse oximetry, re-evaluation of patient's condition, review of old charts, development of treatment plan with patient or surrogate, discussions with consultants, evaluation of patient's response to treatment, examination of patient and obtaining history from patient or surrogate   I assumed direction of critical care for this patient from another provider in my specialty: no     (including critical care time)  Medications Ordered in ED Medications  metoprolol succinate (TOPROL-XL) 24 hr tablet 50 mg (has no administration in time range)  irbesartan (AVAPRO) tablet 150 mg (has no administration in time range)  mirtazapine (REMERON) tablet 7.5 mg (has no administration in time range)  ondansetron (ZOFRAN) tablet 4 mg (has no administration in time range)  apixaban (ELIQUIS) tablet 2.5 mg (has no administration in time range)  Calcium-Vitamin D-Vitamin K [id number]  MG-UNT-MCG CHEW (has no administration in time range)  loratadine (CLARITIN) tablet 10 mg (has no administration in time range)  acetaminophen (TYLENOL) tablet 650 mg (has no administration in time range)    Or  acetaminophen (TYLENOL) suppository 650 mg (has no administration in time range)  ipratropium (ATROVENT) nebulizer solution 0.5 mg (has no administration in time range)  ipratropium (ATROVENT) nebulizer solution 0.5 mg (has no administration in time range)  guaiFENesin (MUCINEX) 12 hr tablet 600 mg (has no administration in time range)  feeding supplement (ENSURE ENLIVE) (ENSURE ENLIVE) liquid 237 mL (has no administration in time range)  cefTRIAXone (ROCEPHIN) 1 g in sodium chloride 0.9 % 100 mL IVPB (0 g Intravenous Stopped 12/31/2019 1646)  azithromycin (ZITHROMAX) 500 mg in sodium chloride 0.9 % 250 mL IVPB (0 mg Intravenous Stopped 01/09/2020 1731)  lactated ringers bolus 1,000 mL (1,000 mLs Intravenous New Bag/Given 12/31/2019 1650)    ED Course  I have reviewed the triage vital signs and the nursing notes.  Pertinent labs & imaging results  that were available during my care of the patient were reviewed by me and considered in my medical decision making (see chart for details).  Clinical Course as of Jan 17 1922  Mon Jan 18, 2020  1700 Spoke with patient's daughter, Edd Fabian. She reports patient has indeed been experiencing intermittent shortness of breath as well as poor oral intake with poor appetite over the last several months.  Beginning Sept 25, they have noticed increased fatigue with the patient staying in bed most of the day, increased "foggy thinking" type confusion, and shortness of breath. Today, she had an episode where she appeared quite shaky and has some shaking in her limbs, but stayed conscious.  She was awake and oriented during this time.  This episode prompted them to call EMS.  We discussed the patient's wishes in terms of DNR status.  I discussed in detail some of  the options including intubation and CPR, what these words mean, and what the outcomes may or may not be.  Daughter was able to answer with some confidence that the patient would not want to be placed on a ventilator and would certainly not want CPR.   [SJ]  1744 Spoke with Dr. Roosevelt Locks, hospitalist. Agrees to admit the patient.   [SJ]    Clinical Course User Index [SJ] Arlynn Stare C, PA-C   MDM Rules/Calculators/A&P                          Several days of shortness of breath, generalized weakness, confusion.  Hypoxic, even with nonrebreather.  Requiring combination of nonrebreather and high flow nasal cannula.  No hypotension.  No persistent fever. I personally reviewed and interpreted the patient's labs and imaging studies. Significant infiltrates on chest x-ray. Leukocytosis.  Covid negative. Patient is critically ill.  When I asked the patient questions that would lead [place] to a CODE STATUS, she requested that we talk to her son or daughter.  I was able to speak with the daughter on this matter and it seems as though patient has wishes consistent with DNR status.   Findings and plan of care discussed with Sherwood Gambler, MD. Dr. Regenia Skeeter personally evaluated and examined this patient.  Vitals:   12/31/2019 1700 01/02/2020 1800 01/09/2020 1802 01/05/2020 1830  BP: 126/64 (!) 157/84  140/68  Pulse: 77 81 79   Resp: (!) 24 (!) 28 (!) 22 (!) 25  Temp:      TempSrc:      SpO2: 96% 90% 92%      Final Clinical Impression(s) / ED Diagnoses Final diagnoses:  Acute respiratory failure with hypoxia [facility])    Rx / DC Orders ED Discharge Orders    None       Layla Maw 12/31/2019 2059    Sherwood Gambler, MD 01/19/20 0730

## 2020-01-18 NOTE — ED Notes (Signed)
Pt straight cath after not urinating since arrival to ED. 631ml urine output noted. Pt placed in brief.

## 2020-01-18 NOTE — H&P (Addendum)
History and Physical    Kristina Bruce GHW:[id number] DOB: 28-Jun-1930 DOA: 12/25/2019  PCP: Jenean Lindau, MD (Confirm with patient/family/NH records and if not entered, this has to be entered at [facility] point of entry) Patient coming from: Home  I have personally briefly reviewed patient's old medical records in [place]  Chief Complaint: SOB  HPI: Kristina Bruce is a 86 y.o. female with medical history significant of paroxysmal A. fib on amiodarone and beta-blocker and Eliquis, hypertension, chronic diastolic CHF, anxiety depression, presented with worsening of shortness of breath.  Patient told me that she started to have shortness of breath sometime in summer this year, she used to like walking outside every day, and she noticed that walking distance tolerance has been decreasing the whole summer, she only has occasional cough never had fever chills.  She also had significant drop of appetite and lost about 12 pounds compared to earlier this year.  She feels too weak, denies any chest pain, no abdominal pain no nauseous vomit or diarrhea.  She lives by herself denies any sick contact.  Patient further denied any cough or choking after eating, and she been eating regular diet without problems.  She received both COVID-19 vaccination in January this year.  Family called EMS this morning for worsening of breathing symptoms, EMS arrived and found patient O2 saturation in the 50s to 60s. ED Course: O2 saturation initially was in the 80s, stabilized around 93-94% with 15 L of high flow oxygen, check x-ray showed diffuse interstitial infiltrates bilaterally, WBC 21.  Review of Systems: As per HPI otherwise 14 point review of systems negative.    Past Medical History:  Diagnosis Date  . Allergic rhinitis   . Choroidal nevus of right eye    Per [place] new patient packet  . Coronary atherosclerosis of native coronary artery   . Dehydration    Per [place] new patient packet  . Essential  hypertension 03/03/2019  . Essential hypertension, benign   . Essential hypertension, benign   . H/O mammogram 2020   Per [place] [facility] pateint [facility]  . Intermediate coronary syndrome ([place])   . Loss of appetite    Per [place] new patient packet  . Low blood pressure    Per [facility] new patient packet  . Mixed hyperlipidemia 03/03/2019  . Nausea    Per [place] new patient packet  . Osteopenia    with Vitamin D Deficiency   . Osteoporosis    Per [place] new patient packet  . PAC (premature atrial contraction) 03/03/2019  . PAF (paroxysmal atrial fibrillation) ([place]) 07/31/2019  . Syncope and collapse 03/03/2019    Past Surgical History:  Procedure Laterality Date  . ABDOMINAL HYSTERECTOMY  1970s   total     reports that she has quit smoking. She quit after 16.00 years of use. She has never used smokeless tobacco. She reports that she does not drink alcohol and does not use drugs.  Not on File  Family History  Problem Relation Age of Onset  . Breast cancer Mother   . Heart attack Son        stents  . Heart disease Son      Prior to Admission medications   Medication Sig Start Date End Date Taking? Authorizing Provider  acetaminophen (TYLENOL) 500 MG tablet Take 500 mg by mouth every 6 (six) hours as needed for moderate pain.   Yes [provider]  amiodarone (PACERONE) 200 MG tablet Take 100 mg  by mouth daily.    [provider]  Calcium Carbonate-Vitamin D (CALCIUM + D PO) Take by mouth daily.    [provider]  ELIQUIS 2.5 MG TABS tablet TAKE 1 TABLET TWICE A DAY 12/31/19   Skeet Latch, MD  ergocalciferol (VITAMIN D2) 1.25 MG (50000 UT) capsule Take by mouth once a week.     [provider]  fexofenadine (ALLEGRA) 60 MG tablet Take 60 mg by mouth as needed.     [provider]  metoprolol succinate (TOPROL-XL) 50 MG 24 hr tablet TAKE ONE-HALF (1/2) TABLET (25 MG TOTAL) DAILY . TAKE WITH OR IMMEDIATELY FOLLOWING A MEAL 01/04/20    Revankar, Reita Cliche, MD  mirtazapine (REMERON) 7.5 MG tablet Take 1 tablet (7.5 mg total) by mouth at bedtime. Patient taking differently: Take 7.5 mg by mouth at bedtime. Taking 1/2 tablet 01/01/20   Lauree Chandler, NP  Multiple Vitamins-Iron (QC DAILY MULTIVITAMINS/IRON) TABS Take 1 tablet by mouth daily. 01/16/20   Medina-Vargas, Monina C, NP  Nutritional Supplements (NUTRI-DRINK PO) Take by mouth daily. [employer]    [provider]  ondansetron (ZOFRAN) 4 MG tablet Take 1 tablet (4 mg total) by mouth 3 (three) times daily as needed for nausea or vomiting. 01/15/20   Medina-Vargas, Monina C, NP  telmisartan (MICARDIS) 40 MG tablet Take 1 tablet (40 mg total) by mouth 2 (two) times daily. 10/28/19   Berniece Salines, DO    Physical Exam: Vitals:   01/07/2020 1654 01/21/2020 1700 01/06/2020 1800 12/23/2019 1802  BP:  126/64 (!) 157/84   Pulse:  77 81 79  Resp:  (!) 24 (!) 28 (!) 22  Temp: 98.3 F (36.8 C)     TempSrc: Oral     SpO2:  96% 90% 92%    Constitutional: NAD, calm, comfortable Vitals:   01/19/2020 1654 12/25/2019 1700 01/17/2020 1800 01/15/2020 1802  BP:  126/64 (!) 157/84   Pulse:  77 81 79  Resp:  (!) 24 (!) 28 (!) 22  Temp: 98.3 F (36.8 C)     TempSrc: Oral     SpO2:  96% 90% 92%   Eyes: PERRL, lids and conjunctivae normal ENMT: Mucous membranes are moist. Posterior pharynx clear of any exudate or lesions.Normal dentition.  Neck: normal, supple, no masses, no thyromegaly Respiratory: Shallow breathing, diffused bilateral fine crackles, no wheezing.  Increased respiratory effort. No accessory muscle use.  Cardiovascular: Regular rate and rhythm, no murmurs / rubs / gallops. No extremity edema. 2+ pedal pulses. No carotid bruits.  Abdomen: no tenderness, no masses palpated. No hepatosplenomegaly. Bowel sounds positive.  Musculoskeletal: Muscle wasting Skin: no rashes, lesions, ulcers. No induration Neurologic: CN 2-12 grossly intact. Sensation intact, DTR normal. Strength  5/5 in all 4.  Psychiatric: Normal judgment and insight. Alert and oriented x 3. Normal mood.     Labs on Admission: I have personally reviewed following labs and imaging studies  CBC: Recent Labs  Lab 12/24/2019 1457 01/15/2020 1614 01/07/2020 1740  WBC 21.7*  --   --   NEUTROABS 19.0*  --   --   HGB 10.3* 10.5* 9.5*  HCT 31.4* 31.0* 28.0*  MCV 91.3  --   --   PLT 473*  --   --    Basic Metabolic Panel: Recent Labs  Lab 12/23/2019 1457 01/19/2020 1614 01/15/2020 1740  NA 128* 127* 129*  K 4.0 4.1 3.9  CL 95*  --   --   CO2 21*  --   --  GLUCOSE 152*  --   --   BUN 13  --   --   CREATININE 0.77  --   --   CALCIUM 7.8*  --   --    GFR: CrCl cannot be calculated (Unknown ideal weight.). Liver Function Tests: Recent Labs  Lab 01/14/2020 1457  AST 42*  ALT 34  ALKPHOS 97  BILITOT 0.9  PROT 5.6*  ALBUMIN 1.8*   No results for input(s): LIPASE, AMYLASE in the last 168 hours. No results for input(s): AMMONIA in the last 168 hours. Coagulation Profile: No results for input(s): INR, PROTIME in the last 168 hours. Cardiac Enzymes: No results for input(s): CKTOTAL, CKMB, CKMBINDEX, TROPONINI in the last 168 hours. BNP (last 3 results) No results for input(s): PROBNP in the last 8760 hours. HbA1C: No results for input(s): HGBA1C in the last 72 hours. CBG: No results for input(s): GLUCAP in the last 168 hours. Lipid Profile: No results for input(s): CHOL, HDL, LDLCALC, TRIG, CHOLHDL, LDLDIRECT in the last 72 hours. Thyroid Function Tests: No results for input(s): TSH, T4TOTAL, FREET4, T3FREE, THYROIDAB in the last 72 hours. Anemia Panel: No results for input(s): VITAMINB12, FOLATE, FERRITIN, TIBC, IRON, RETICCTPCT in the last 72 hours. Urine analysis:    Component Value Date/Time   COLORURINE YELLOW 08/02/2019 2008   APPEARANCEUR CLEAR 08/02/2019 2008   LABSPEC 1.010 08/02/2019 2008   PHURINE 6.0 08/02/2019 2008   GLUCOSEU NEGATIVE 08/02/2019 2008   HGBUR NEGATIVE  08/02/2019 2008   BILIRUBINUR Negative 01/15/2020 1701   KETONESUR NEGATIVE 08/02/2019 2008   PROTEINUR Positive (A) 01/15/2020 1701   PROTEINUR NEGATIVE 08/02/2019 2008   UROBILINOGEN 1.0 01/15/2020 1701   UROBILINOGEN 0.2 01/18/2007 1358   NITRITE Negative 01/15/2020 1701   NITRITE NEGATIVE 08/02/2019 2008   LEUKOCYTESUR Negative 01/15/2020 1701   LEUKOCYTESUR TRACE (A) 08/02/2019 2008    Radiological Exams on Admission: DG Chest Portable 1 View  Result Date: 12/23/2019 CLINICAL DATA:  Shortness of breath and lethargy. EXAM: PORTABLE CHEST 1 VIEW COMPARISON:  August 05, 2019 FINDINGS: The lungs are hyperinflated. Marked severity bilateral infiltrates are seen. This involves most of the right lung, the mid left lung and the left lung base. A small left pleural effusion is noted. No pneumothorax is identified. The heart size and mediastinal contours are within normal limits. The visualized skeletal structures are unremarkable. IMPRESSION: 1. Marked severity bilateral infiltrates. 2. Small left pleural effusion. Electronically Signed   By: Virgina Norfolk M.D.   On: 01/14/2020 15:36    EKG: Independently reviewed.  Sinus, incomplete right bundle branch block chronic  Assessment/Plan Active Problems:   Hypoxia   PNA (pneumonia)  (please populate well all problems here in Problem List. (For example, if patient is on BP meds at home and you resume or decide to hold them, it is a problem that needs to be her. Same for CAD, COPD, HLD and so on)  Acute (probably on chronic) hypoxic respiratory failure -There is chronic element of exertional dyspnea for 3 to 70-month, patient symptoms does not admit there is acute deterioration rather than a slow worsening of her breathing problems -The pattern of lung infiltrate seems interstitial rather than consolidation or bronchial, shallow fast breathing pattern and diffuse crackle or implying a interstitial lung problem rather than lobular  pneumonia -Noticed that the patient has known cardiac history of paroxysmal A. fib for which she has been following with cardiologist quite frequently this year, and symptomatic there was no difficulty to control her  heart rate and cardiology has been trying between calcium channel blocker and beta-blocker with limited effect.  Seems like on April 15, cardiologist finally decided to put patient on amiodarone.  Given what was found on the x-ray today, I discussed with pulmonary attending Dr. Tamala Julian, who also suspect patient may have amiodarone toxicity and pulmonary fibrosis, pulmonary recommend hold antibiotics for now and will see the patient and sent more test. -Also send atypical pneumonia work-up and Quantiferon.  -Atrovent for breathing treatment  Lactic acidosis -Clinically patient looks euvolemic and her blood pressure is on the high side, and given low albumin level/impaired hepatic synthetic function suspect patient also has some underlying cirrhosis?  -Hold off IV fluids for now, check RUQ ultrasound  Hypoalbuminemia -Suspect there is hepatic synthetic function impairment, check RUQ ultrasound -UA pending to rule out any proteinuria -Other etiology as chronic protein malnutrition, will check prealbumin and consult dietitian  Hyponatremia, euvolemic, -Probably SIADH from chronic hypoxia/lung problem -Fluid restriction   Paroxysmal A. Fib -Continue beta-blocker, discontinue amiodarone -Continue Eliquis  Chronic diastolic CHF -Euvolemic, blood pressure controlled, continue ARB and beta-blocker  Severe protein calorie malnutrition -Estimated BMI less than 20 -Nutrition evaluation  DVT prophylaxis: Eliquis Code Status: DNR Family Communication: Left daughter message Disposition Plan: Patient has significant hypoxia with extensive lung changes, expect more than 2 midnight hospital stay to wean off/down oxygen Consults called: Pulmonology Admission status: PCU   Lequita Halt  MD [facility] Pager [phone]  01/02/2020, 6:34 PM

## 2020-01-19 ENCOUNTER — Inpatient Hospital Stay (HOSPITAL_COMMUNITY): Payer: Medicare Other

## 2020-01-19 ENCOUNTER — Encounter (HOSPITAL_COMMUNITY): Payer: Self-pay | Admitting: Internal Medicine

## 2020-01-19 DIAGNOSIS — J189 Pneumonia, unspecified organism: Secondary | ICD-10-CM | POA: Diagnosis not present

## 2020-01-19 DIAGNOSIS — I351 Nonrheumatic aortic (valve) insufficiency: Secondary | ICD-10-CM

## 2020-01-19 DIAGNOSIS — R0902 Hypoxemia: Secondary | ICD-10-CM

## 2020-01-19 DIAGNOSIS — I361 Nonrheumatic tricuspid (valve) insufficiency: Secondary | ICD-10-CM

## 2020-01-19 LAB — ECHOCARDIOGRAM COMPLETE
Area-P 1/2: 2.73 cm2
Height: 65 in
P 1/2 time: 392 msec
S' Lateral: 2.4 cm
Weight: 1600 oz

## 2020-01-19 LAB — CBC
HCT: 32.1 % — ABNORMAL LOW (ref 36.0–46.0)
Hemoglobin: 10.4 g/dL — ABNORMAL LOW (ref 12.0–15.0)
MCH: 29.4 pg (ref 26.0–34.0)
MCHC: 32.4 g/dL (ref 30.0–36.0)
MCV: 90.7 fL (ref 80.0–100.0)
Platelets: 495 10*3/uL — ABNORMAL HIGH (ref 150–400)
RBC: 3.54 MIL/uL — ABNORMAL LOW (ref 3.87–5.11)
RDW: 13.6 % (ref 11.5–15.5)
WBC: 29.3 10*3/uL — ABNORMAL HIGH (ref 4.0–10.5)
nRBC: 0 % (ref 0.0–0.2)

## 2020-01-19 LAB — BASIC METABOLIC PANEL
Anion gap: 11 (ref 5–15)
BUN: 10 mg/dL (ref 8–23)
CO2: 24 mmol/L (ref 22–32)
Calcium: 8.2 mg/dL — ABNORMAL LOW (ref 8.9–10.3)
Chloride: 94 mmol/L — ABNORMAL LOW (ref 98–111)
Creatinine, Ser: 0.53 mg/dL (ref 0.44–1.00)
GFR calc Af Amer: >60 mL/min (ref >60)
GFR calc non Af Amer: >60 mL/min (ref >60)
Glucose, Bld: 110 mg/dL — ABNORMAL HIGH (ref 70–99)
Potassium: 3.8 mmol/L (ref 3.5–5.1)
Sodium: 129 mmol/L — ABNORMAL LOW (ref 135–145)

## 2020-01-19 LAB — PROCALCITONIN: Procalcitonin: 0.97 ng/mL

## 2020-01-19 LAB — PREALBUMIN: Prealbumin: <5 mg/dL — ABNORMAL LOW (ref 18–38)

## 2020-01-19 LAB — SEDIMENTATION RATE: Sed Rate: 85 mm/hr — ABNORMAL HIGH (ref 0–22)

## 2020-01-19 LAB — C-REACTIVE PROTEIN: CRP: 27.5 mg/dL — ABNORMAL HIGH (ref <1.0)

## 2020-01-19 MED ORDER — ORAL CARE MOUTH RINSE: 15.0000 mL | Freq: Two times a day (BID) | OROMUCOSAL | Status: DC

## 2020-01-19 MED ORDER — SODIUM CHLORIDE 0.9 % IV SOLN
500.0000 mg | INTRAVENOUS | Status: DC
  Administered 2020-01-19 – 2020-01-20 (×2): 500 mg via INTRAVENOUS
  Filled 2020-01-19 (×2): qty 500

## 2020-01-19 MED ORDER — SODIUM CHLORIDE 0.9 % IV SOLN
2.0000 g | INTRAVENOUS | Status: DC
  Administered 2020-01-19 – 2020-01-20 (×2): 2 g via INTRAVENOUS
  Filled 2020-01-19: qty 2
  Filled 2020-01-19: qty 20

## 2020-01-19 MED ORDER — SODIUM CHLORIDE 0.9 % IV BOLUS
200.0000 mL | Freq: Once | INTRAVENOUS | Status: AC
  Administered 2020-01-19: 200 mL via INTRAVENOUS

## 2020-01-19 MED ORDER — ALBUTEROL SULFATE (2.5 MG/3ML) 0.083% IN NEBU
2.5000 mg | INHALATION_SOLUTION | RESPIRATORY_TRACT | Status: DC | PRN
  Administered 2020-01-19 – 2020-01-20 (×2): 2.5 mg via RESPIRATORY_TRACT
  Filled 2020-01-19: qty 3

## 2020-01-19 MED ORDER — FUROSEMIDE 10 MG/ML IJ SOLN
40.0000 mg | Freq: Once | INTRAMUSCULAR | Status: AC
  Administered 2020-01-19: 40 mg via INTRAVENOUS
  Filled 2020-01-19: qty 4

## 2020-01-19 MED ORDER — METHYLPREDNISOLONE SODIUM SUCC 125 MG IJ SOLR
60.0000 mg | Freq: Every day | INTRAMUSCULAR | Status: DC
  Administered 2020-01-19 – 2020-01-20 (×2): 60 mg via INTRAVENOUS
  Filled 2020-01-19 (×2): qty 2

## 2020-01-19 NOTE — Progress Notes (Signed)
   01/19/20 2113  Assess: MEWS Score  BP (!) 165/107  Pulse Rate (!) 101  ECG Heart Rate (!) 101  Resp (!) 24  SpO2 (!) 66 %  O2 Device HFNC;Non-rebreather Mask  Assess: MEWS Score  MEWS Temp 0  MEWS Systolic 0  MEWS Pulse 1  MEWS RR 1  MEWS LOC 0  MEWS Score 2  MEWS Score Color Yellow  Assess: if the MEWS score is Yellow or Red  Were vital signs taken at a resting state? No  Focused Assessment Change from prior assessment (see assessment flowsheet)  Early Detection of Sepsis Score *See Row Information* High  MEWS guidelines implemented *See Row Information* Yes  Treat  MEWS Interventions Escalated (See documentation below)  Pain Scale 0-10  Pain Score 0  Take Vital Signs  Increase Vital Sign Frequency  Yellow: Q 2hr X 2 then Q 4hr X 2, if remains yellow, continue Q 4hrs  Escalate  MEWS: Escalate Yellow: discuss with charge nurse/RN and consider discussing with provider and RRT  Notify: Charge Nurse/RN  Name of Charge Nurse/RN Notified Angie, RN  Date Charge Nurse/RN Notified 01/19/20  Time Charge Nurse/RN Notified 2113  Notify: Provider  Provider Name/Title Sallee Provencal, MD  Date Provider Notified 01/19/20  Time Provider Notified 2115  Notification Type Page  Notification Reason Change in status  Response See new orders  Date of Provider Response 01/19/20  Time of Provider Response 2115  Notify: Rapid Response  Name of Rapid Response RN Notified Shanon Brow, RN  Date Rapid Response Notified 01/19/20  Time Rapid Response Notified 2115  Document  Patient Outcome Stabilized after interventions  Progress note created (see row info) Yes

## 2020-01-19 NOTE — Progress Notes (Signed)
   01/19/20 2115  What Happened  Was fall witnessed? No  Was patient injured? No  Patient found on floor  Found by Staff-comment [facility])  Stated prior activity other (comment) (confused, trying to get out of bed)  Follow Up  MD notified Ellwood Sayers, MD  Time MD notified 2113  Family notified Yes - comment (Son-Bob)  Time family notified 2220  Additional tests Yes-comment  Simple treatment Other (comment) (cleaned up hands and feet, covered with warm blankets)  Progress note created (see row info) Yes  Adult Fall Risk Assessment  Risk Factor Category (scoring not indicated) Fall has occurred during this admission (document High fall risk)  Age 86  Fall History: Fall within 6 months prior to admission 0  Elimination; Bowel and/or Urine Incontinence 0  Elimination; Bowel and/or Urine Urgency/Frequency 0  Medications: includes PCA/Opiates, Anti-convulsants, Anti-hypertensives, Diuretics, Hypnotics, Laxatives, Sedatives, and Psychotropics 3  Patient Care Equipment 2  Mobility-Assistance 2  Mobility-Gait 2  Mobility-Sensory Deficit 0  Altered awareness of immediate physical environment 1  Impulsiveness 2  Lack of understanding of one's physical/cognitive limitations 4  Total Score 19  Patient Fall Risk Level High fall risk  Adult Fall Risk Interventions  Required Bundle Interventions *See Row Information* High fall risk - low, moderate, and high requirements implemented  Additional Interventions Room near nurses station;Use of appropriate toileting equipment (bedpan, BSC, etc.);Camera surveillance (with patient/family notification & education);Reorient/diversional activities with confused patients  Screening for Fall Injury Risk (To be completed on HIGH fall risk patients) - Assessing Need for Low Bed  Risk For Fall Injury- Low Bed Criteria 85 years or older  Will Implement Low Bed and Floor Mats Yes  Vitals  Temp (!) 96.5 F (35.8 C)  Temp Source Rectal  BP (!) 165/107   MAP (mmHg) 124  BP Location Right Arm  BP Method Automatic  Patient Position (if appropriate) Sitting  Pulse Rate 100  ECG Heart Rate 100  Resp (!) 24  Oxygen Therapy  SpO2 (!) 80 %  O2 Device HFNC;Non-rebreather Mask  Neurological  Neuro (WDL) X  Level of Consciousness Alert  Orientation Level Oriented to person;Disoriented to time;Disoriented to situation  Cognition Follows commands;Poor attention/concentration;Poor judgement;Poor safety awareness  Speech Clear  Pupil Assessment  No  Neuro Symptoms Forgetful;Anxiety  Neuro symptoms relieved by Rest  Neuro Additional Assessments No  Glasgow Coma Scale  Eye Opening 4  Best Verbal Response (NON-intubated) 4  Best Motor Response 6  Glasgow Coma Scale Score 14  Musculoskeletal  Musculoskeletal (WDL) WDL  Assistive Device None  Weight Bearing Restrictions No  Integumentary  Integumentary (WDL) X  Oxygen Therapy/Pulse Ox  $ High Flow Nasal Cannula  Yes

## 2020-01-19 NOTE — Progress Notes (Signed)
PROGRESS NOTE  Kristina Bruce VQM:[id number] DOB: 1930/09/17 DOA: 01/19/2020 PCP: Jenean Lindau, MD   LOS: 1 day   Brief narrative: As per HPI,   Kristina Bruce is a 86 y.o. female with medical history significant of paroxysmal A. fib on amiodarone and beta-blocker and Eliquis, hypertension, chronic diastolic CHF, anxiety depression, presented to the hospital with worsening of shortness of breath.  Patient had been having some shortness of breath through the summer especially on exertion with decreasing exercise tolerance.  She did have the loss of appetite and significant weight loss and has been having generalized weakness.  Patient lives by herself.  Family called EMS due to her worsening breathing symptoms.  EMS found the patient hypoxic with  O2 saturation in the 50s to 60s.  In the ED, O2 saturation initially was in the 80s, stabilized around 93-94% with 15 L of high flow oxygen, check x-ray showed diffuse interstitial infiltrates bilaterally, WBC 21.  Patient was then admitted to hospital for further evaluation and treatment.  Assessment/Plan:  Active Problems:   Hypoxia   PNA (pneumonia)  Acute hypoxic respiratory failure Patient was on  nonrebreather mask.  Still hypoxic.  Will change oxygen to high flow nasal cannula with humidified air.  She did have chronic exertional dyspnea for 3 to 4 months.  Progressive symptoms and chest x-ray showing bilateral interstitial and airspace opacification.  CT scan of the chest shows multifocal and diffuse airspace disease with areas of groundglass consolidation and septal thickening.  Has been on amiodarone since April 2021 for atrial fibrillation.  Possibility of amiodarone toxicity and pulmonary fibrosis.  Cannot rule out coexisting pneumonia.  Autoimmune and vasculitic work-up has been sent by pulmonary.  Patient has been seen by pulmonary.  ESR is elevated.  Follow mycoplasma pneumoniae and Legionella pneumophilae antigen.  Procalcitonin was  0.97.  Continue bronchodilators.  Pulmonary recommended Solu-Medrol 1 mg/kg per 3 days then prednisone daily.  Patient with episodes of hallucinations likely from hypoxia infection and hospital stay.  Prognosis of the patient appears to be guarded.  Will give 1 dose of IV Lasix.   Possible community-acquired pneumonia.  Procalcitonin 0.9.  Pulmonary has seen the patient.  Recommend continuation of antibiotic for now.  Lactic acidosis Could be be secondary to respiratory distress.  On high flow oxygen steroids and antibiotic.   Check lactate in a.m.    Hypoalbuminemia Right upper quadrant ultrasound shows some micronodularity of the hepatic surface could be secondary to cirrhosis or hepatic congestion.  Will follow LFT.  Hyponatremia,  -Probably SIADH from chronic hypoxia lung disease.  Continue fluid restriction for now.   Paroxysmal A. Fib Continue beta-blocker and Eliquis. Off amiodarone.  Chronic diastolic CHF Currently compensated without peripheral edema, and appears euvolemic.Marland Kitchen  Continue beta-blockers and ARB.  2D echocardiogram from today showing LV ejection fraction of 65 to 70% with no regional wall motion abnormalities, grade 1 diastolic dysfunction.  Failure to thrive. Present on admission.  Estimated BMI less than 20 -Nutrition evaluation  DVT prophylaxis: apixaban (ELIQUIS) tablet 2.5 mg Start: 01/04/2020 2200 apixaban (ELIQUIS) tablet 2.5 mg   Code Status: DNR  Family Communication: Spoke with the patient's son at bedside and updated him about the clinical condition of the patient.  Confirmed DNR status.  Patient's son is aware that her mother's condition is not looking good and prognosis is guarded..  Status is: Inpatient  Remains inpatient appropriate because:IV treatments appropriate due to intensity of illness or inability to take PO,  Inpatient level of care appropriate due to severity of illness and Severe hypoxic respiratory failure   Dispo: The patient is  from: Home              Anticipated d/c is to: Undetermined at this time              Anticipated d/c date is: > 3 days              Patient currently is not medically stable to d/c.  Consultants:  Pulmonary  Procedures:  None so far  Antibiotics:  . Rocephin and Zithromax 9/27>  Anti-infectives (From admission, onward)   Start     Dose/Rate Route Frequency Ordered Stop   01/19/20 1600  cefTRIAXone (ROCEPHIN) 2 g in sodium chloride 0.9 % 100 mL IVPB        2 g 200 mL/hr over 30 Minutes Intravenous Every 24 hours 01/19/20 0848     01/19/20 1600  azithromycin (ZITHROMAX) 500 mg in sodium chloride 0.9 % 250 mL IVPB        500 mg 250 mL/hr over 60 Minutes Intravenous Every 24 hours 01/19/20 0848     01/19/20 1000  cefTRIAXone (ROCEPHIN) 1 g in sodium chloride 0.9 % 100 mL IVPB  Status:  Discontinued        1 g 200 mL/hr over 30 Minutes Intravenous Every 24 hours 01/21/2020 1817 12/30/2019 1831   01/19/20 1000  azithromycin (ZITHROMAX) tablet 500 mg  Status:  Discontinued        500 mg Oral Daily 01/11/2020 1817 12/31/2019 1831   01/04/2020 1545  cefTRIAXone (ROCEPHIN) 1 g in sodium chloride 0.9 % 100 mL IVPB        1 g 200 mL/hr over 30 Minutes Intravenous  Once 01/08/2020 1535 01/21/2020 1646   01/19/2020 1545  azithromycin (ZITHROMAX) 500 mg in sodium chloride 0.9 % 250 mL IVPB        500 mg 250 mL/hr over 60 Minutes Intravenous  Once 01/11/2020 1535 01/03/2020 1731     Subjective: Today, patient was seen and examined at bedside.  Patient was in moderate respiratory distress.  Patient's son at bedside reported that she was hallucinating today.  Was on nonrebreather mask but was still hypoxic.  Rapid response team at bedside.  Objective: Vitals:   01/19/20 1145 01/19/20 1338  BP: (!) 157/80 (!) 187/86  Pulse: 74 78  Resp: (!) 29   Temp:  (!) 97.1 F (36.2 C)  SpO2: 97% (!) 88%   No intake or output data in the 24 hours ending 01/19/20 1409 Filed Weights   01/19/20 0557  Weight: 45.4 kg    Body mass index is 16.64 kg/m.   Physical Exam: GENERAL: Patient is responsive to conversation, on nonrebreather mask, moderate respiratory distress, appears frail and deconditioned, thinly built HENT: No scleral pallor or icterus. Pupils equally reactive to light. Oral mucosa is moist NECK: is supple, no gross swelling noted. CHEST:   Diminished breath sounds bilaterally.  Coarse crackles noted over the bases. CVS: S1 and S2 heard, no murmur. Regular rate and rhythm.  Distended neck veins. ABDOMEN: Soft, non-tender, bowel sounds are present. EXTREMITIES: No edema. CNS: Cranial nerves are intact.  Moving extremities, SKIN: warm and dry without rashes.  Data Review: I have personally reviewed the following laboratory data and studies,  CBC: Recent Labs  Lab 12/29/2019 1457 12/27/2019 1614 01/19/2020 1740 01/19/20 0555  WBC 21.7*  --   --  29.3*  NEUTROABS 19.0*  --   --   --  HGB 10.3* 10.5* 9.5* 10.4*  HCT 31.4* 31.0* 28.0* 32.1*  MCV 91.3  --   --  90.7  PLT 473*  --   --  527*   Basic Metabolic Panel: Recent Labs  Lab 01/13/2020 1457 01/07/2020 1614 01/02/2020 1740 01/19/20 0555  NA 128* 127* 129* 129*  K 4.0 4.1 3.9 3.8  CL 95*  --   --  94*  CO2 21*  --   --  24  GLUCOSE 152*  --   --  110*  BUN 13  --   --  10  CREATININE 0.77  --   --  0.53  CALCIUM 7.8*  --   --  8.2*   Liver Function Tests: Recent Labs  Lab 12/24/2019 1457  AST 42*  ALT 34  ALKPHOS 97  BILITOT 0.9  PROT 5.6*  ALBUMIN 1.8*   No results for input(s): LIPASE, AMYLASE in the last 168 hours. No results for input(s): AMMONIA in the last 168 hours. Cardiac Enzymes: No results for input(s): CKTOTAL, CKMB, CKMBINDEX, TROPONINI in the last 168 hours. BNP (last 3 results) Recent Labs    01/07/2020 1457  BNP 209.5*    ProBNP (last 3 results) No results for input(s): PROBNP in the last 8760 hours.  CBG: No results for input(s): GLUCAP in the last 168 hours. Recent Results (from the past 240  hour(s))  SARS-COV-2 RNA,(COVID-19) QUAL NAAT     Status: None   Collection Time: 01/15/20  5:26 PM  Result Value Ref Range Status   SARS CoV2 RNA Not Detected Not Detect Final    Comment: . A Not Detected (negative) test result for this test means that SARS-CoV-2 RNA was not present in the specimen above the limit of detection. A negative result does not rule out the possibility of COVID-19 and should not be used as the sole basis for treatment or patient management decisions.  If COVID-19 is still suspected, based on exposure history together with other clinical findings, re-testing should be considered in consultation with public health authorities. Laboratory test results should always be considered in the context of clinical observations and epidemiological data in making a final diagnosis and patient management decisions. . Please review the "Fact Sheets" and FDA authorized labeling available for health care providers and patients using the following websites: [email or web address]/ [facility]/fact-sheet [email or web address]://[email or web address]/home/[email or web address]/ Patients/[facility]/fact-sheet.html . This test has been authorized  by the FDA under an Emergency Use Authorization (EUA) for use by authorized laboratories. . Due to the current public health emergency, [facility] is receiving a high volume of samples from a wide variety of swabs and media for COVID-19 testing. In order to serve patients during this public health crisis, samples from appropriate clinical sources are being tested. Negative test results derived from specimens received in non-commercially manufactured viral collection and transport media, or in media and sample collection kits not yet authorized by FDA for COVID-19 testing should be cautiously evaluated and the patient potentially subjected to extra precautions such as additional clinical monitoring, including collection  of an additional specimen. . Methodology:  Nucleic Acid Amplification Test (NAAT) includes RT-PCR or TMA . Additional information about COVID-19 can be found at the [employer] website: www.[email or web address].com/[email or web address]   Respiratory Panel by RT PCR (Flu A&B, Covid) - Nasopharyngeal Swab     Status: None   Collection Time: 01/05/2020  2:57 PM   Specimen: Nasopharyngeal Swab  Result Value Ref Range Status   SARS Coronavirus 2 by RT PCR NEGATIVE NEGATIVE Final  Comment: (NOTE) SARS-CoV-2 target nucleic acids are NOT DETECTED.  The SARS-CoV-2 RNA is generally detectable in upper respiratoy specimens during the acute phase of infection. The lowest concentration of SARS-CoV-2 viral copies this assay can detect is 131 copies/mL. A negative result does not preclude SARS-Cov-2 infection and should not be used as the sole basis for treatment or other patient management decisions. A negative result may occur with  improper specimen collection/handling, submission of specimen other than nasopharyngeal swab, presence of viral mutation(s) within the areas targeted by this assay, and inadequate number of viral copies (<131 copies/mL). A negative result must be combined with clinical observations, patient history, and epidemiological information. The expected result is Negative.  Fact Sheet for Patients:  PinkCheek.be  Fact Sheet for Healthcare Providers:  GravelBags.it  This test is no t yet approved or cleared by the [place] FDA and  has been authorized for detection and/or diagnosis of SARS-CoV-2 by FDA under an Emergency Use Authorization (EUA). This EUA will remain  in effect (meaning this test can be used) for the duration of the COVID-19 declaration under Section 564(b)(1) of the Act, 21 U.S.C. section 360bbb-3(b)(1), unless the authorization is terminated or revoked sooner.     Influenza A by PCR NEGATIVE  NEGATIVE Final   Influenza B by PCR NEGATIVE NEGATIVE Final    Comment: (NOTE) The Xpert Xpress SARS-CoV-2/FLU/RSV assay is intended as an aid in  the diagnosis of influenza from Nasopharyngeal swab specimens and  should not be used as a sole basis for treatment. Nasal washings and  aspirates are unacceptable for Xpert Xpress SARS-CoV-2/FLU/RSV  testing.  Fact Sheet for Patients: PinkCheek.be  Fact Sheet for Healthcare Providers: GravelBags.it  This test is not yet approved or cleared by the [place] FDA and  has been authorized for detection and/or diagnosis of SARS-CoV-2 by  FDA under an Emergency Use Authorization (EUA). This EUA will remain  in effect (meaning this test can be used) for the duration of the  Covid-19 declaration under Section 564(b)(1) of the Act, 21  U.S.C. section 360bbb-3(b)(1), unless the authorization is  terminated or revoked. Performed at [facility], [place] [address], [place], [place] [address]   Culture, blood (routine x 2)     Status: None (Preliminary result)   Collection Time: 01/10/2020  2:57 PM   Specimen: BLOOD LEFT ARM  Result Value Ref Range Status   Specimen Description BLOOD LEFT ARM  Final   Special Requests   Final    BOTTLES DRAWN AEROBIC AND ANAEROBIC Blood Culture adequate volume   Culture   Final    NO GROWTH < 24 HOURS Performed at [facility], [place] [address] [place], [place] [address]    Report Status PENDING  Incomplete  Culture, blood (routine x 2)     Status: None (Preliminary result)   Collection Time: 12/30/2019  3:41 PM   Specimen: BLOOD  Result Value Ref Range Status   Specimen Description BLOOD UPPER LEFT  Final   Special Requests   Final    BOTTLES DRAWN AEROBIC AND ANAEROBIC Blood Culture results may not be optimal due to an inadequate volume of blood received in culture bottles   Culture   Final    NO GROWTH < 24 HOURS Performed at [facility], [place] [address] [place], [place] [address]    Report Status PENDING  Incomplete     Studies: CT CHEST WO CONTRAST  Result Date: 01/08/2020 CLINICAL DATA:  Pneumonia, unresolved EXAM: CT CHEST WITHOUT CONTRAST TECHNIQUE: Multidetector CT imaging of the chest was performed following the standard protocol without IV contrast. COMPARISON:  Radiograph 01/15/2020.  Radiograph 08/05/2019. FINDINGS: Cardiovascular: Aortic atherosclerosis. No aortic aneurysm. There is cardiomegaly. Small pericardial effusion measures up to 8 mm in depth anteriorly. Fluid in the superior pericardial recesses. There are coronary artery calcifications. Mediastinum/Nodes: Lack of contrast and paucity of mediastinal fat limits assessment for adenopathy. Mildly enlarged lower paratracheal node measuring 11 mm, series 3, image 46. No evidence of esophageal wall thickening. No suspicious thyroid nodule. Lungs/Pleura: Multifocal and diffuse airspace disease with areas of ground-glass, consolidation, as well as septal thickening. There is a basilar predominant distribution. Small pleural effusions with fluid tracking into the right minor fissure. No evidence of high attenuation opacities. There are scattered calcified granulomas in the right lower lobe. Upper Abdomen: No acute or unexpected findings. Musculoskeletal: There are no acute or suspicious osseous abnormalities. Prominent Schmorl's node in superior endplate of L2. IMPRESSION: 1. Multifocal and diffuse airspace disease with areas of ground-glass, consolidation, as well as septal thickening. There is a basilar predominant distribution. Differential considerations include multifocal pneumonia, including atypical organisms or COVID 19, diffuse alveolar damage, organizing pneumonia, or potentially amiodarone toxicity, there is no high-density consolidation. 2. Small pleural effusions. 3. Cardiomegaly with small pericardial effusion. Coronary artery calcifications. 4. Mildly  enlarged lower paratracheal lymph node is likely reactive. Aortic Atherosclerosis (ICD10-I70.0). Electronically Signed   By: Keith Rake M.D.   On: 01/05/2020 18:47   DG Chest Portable 1 View  Result Date: 12/24/2019 CLINICAL DATA:  Shortness of breath and lethargy. EXAM: PORTABLE CHEST 1 VIEW COMPARISON:  August 05, 2019 FINDINGS: The lungs are hyperinflated. Marked severity bilateral infiltrates are seen. This involves most of the right lung, the mid left lung and the left lung base. A small left pleural effusion is noted. No pneumothorax is identified. The heart size and mediastinal contours are within normal limits. The visualized skeletal structures are unremarkable. IMPRESSION: 1. Marked severity bilateral infiltrates. 2. Small left pleural effusion. Electronically Signed   By: Virgina Norfolk M.D.   On: 12/23/2019 15:36   US Abdomen Limited RUQ  Result Date: 01/09/2020 CLINICAL DATA:  Elevation of LFTs EXAM: ULTRASOUND ABDOMEN LIMITED RIGHT UPPER QUADRANT COMPARISON:  CT chest 01/07/2020, CT abdomen pelvis 06/07/2017 FINDINGS: Gallbladder: No visible gallstones or significant gallbladder wall thickening however patient was unable to reposition for decubitus imaging. A small amount of pericholecystic fluid is present. Sonographic Percell Miller sign is negative. Common bile duct: Diameter: 3.7 mm, nondilated. Liver: Questionable micro nodularity of the liver surface. No focal lesion identified. Within normal limits in parenchymal echogenicity. Portal vein is patent on color Doppler imaging with normal direction of blood flow towards the liver. Other: Incidental note made of a right pleural effusion. IMPRESSION: Question some mild micro nodularity of the hepatic surface contour. Could be seen in the setting of intrinsic liver disease/cirrhosis or hepatic congestion. Trace amount of pericholecystic fluid without visible cholelithiasis, gallbladder wall thickening or sonographic Murphy sign. Finding is  nonspecific, particularly in the setting of suspected intrinsic liver disease. Correlate with exam findings and if there is persisting clinical concern for acute cholecystitis, HIDA could be obtained. Incidentally noted right pleural effusion. Electronically Signed   By: Lovena Le M.D.   On: 12/30/2019 21:48      Flora Lipps, MD  [facility] 01/19/2020

## 2020-01-19 NOTE — Progress Notes (Signed)
Called to patients room due to fall and desaturation in the 60's.  Patients SP02 was 90% by the time I arrived.  Patient was currently on heated high flow nasal cannula 100% and 30L and NRB mask.  Rapid Response was called as well and assessment was completed.  Patient was able to speak her name and answer questions.  Patient was repositioned in bed and was tolerating current oxygen devices fine when I exited the room.  I will continue to follow and reassess as needed.

## 2020-01-19 NOTE — ED Notes (Signed)
[phone], Kristina Bruce would like an update.

## 2020-01-19 NOTE — Significant Event (Signed)
Rapid Response Event Note   Reason for Call : Oxygen saturations dropped to 88%  Initial Focused Assessment:  Notified by staff that pt saturations dropped to 88% on NRB/HHFNC. Pt is responsive to noxious stimuli, withdraws all 4 extremities to pain, PERRLA. Nursing states that her neuro exam is unchanged from earlier. Pt placed in semi-fowlers position and sats are 94% on NRB mask at 15L and HHFNC at 100% Fio2 at 30L. Pt is at the maximum supplimental oxygen we can provide.  Due to the amount of oxygen pt is requiring, pt is unable to transport safely without decompensation until her oxygen requirement is decreased.    Interventions:  -Repositioned in bed [facility] greater than 45 degrees  Plan of Care:  -Notify primary service of event and further orders    MD Notified: 2150 by nursing staff Call Time: 2120 Arrival Time: 2125 End Time: 2155  Madelynn Done, RN

## 2020-01-19 NOTE — Progress Notes (Signed)
SLP Cancellation Note  Patient Details Name: Kristina Bruce MRN: [id number] DOB: 10/27/1930   Cancelled treatment:       Reason Eval/Treat Not Completed: Medical issues which prohibited therapy. Per chart review and discussion with RN, pt not appropriate for a swallowing evaluation this afternoon in light of current respiratory status. She is holding POs given significant SOB, need for heated HFNC and NRB. Will f/u as able.   Osie Bond., M.A. [facility] Pager [phone] Office [phone]  01/19/2020, 3:45 PM

## 2020-01-19 NOTE — Progress Notes (Signed)
Paged MD on call, pt can't travel downstairs for CT head due to oxygen needs (heated HFNC, 100%, 30L and non-rebreather)  Received a call back to hold off on CT head and continue to monitor level of consciousness. Will notify MD with any changes.

## 2020-01-19 NOTE — Significant Event (Signed)
Patient had a fall earlier which was unwitnessed.  Ordered CT scan of the head and x-ray pelvis.  CT scan of the head was pending at this time given that patient is on high flow oxygen.  X-ray pelvis did not show anything acute.  Patient subsequently started developing hypothermia with temperature in the 96 F and hypotensive.  Lactic acid has been ordered and I also added vancomycin to patient's antibiotics.  I did discuss with patient's daughter about patient's worsening clinical condition.  Gean Birchwood

## 2020-01-19 NOTE — Evaluation (Signed)
Physical Therapy Evaluation Patient Details Name: Kristina Bruce MRN: [id number] DOB: [birth year] Today's Date: 01/19/2020   History of Present Illness  Pt is an 86 y/o female admitted secondary to hypoxic respiratory failure and confusion.  Diffuse parenchymal lung disease seen on chest imaging. PMH includes HTN, a fib, CHF, and kyphoplasty.   Clinical Impression  Pt admitted secondary to problem above with deficits below. Was cleared by RN to see while in ED. Performed bed mobility tasks with supervision. No increase in SOB, however, sats decreased to 86% on 30 L of HFNC and non-rebreather, so returned to supine. Oxygen sats returned to 92-93% upon return to supine. Notified RN. Per son, pt from home alone, but family able to provide assist if needed. Will continue to follow acutely and update recommendations as pt progresses.    Follow Up Recommendations Other (comment) (TBD pending progression )    Equipment Recommendations  Other (comment) (TBD pending progression )    Recommendations for Other Services       Precautions / Restrictions Precautions Precautions: Fall;Other (comment) Precaution Comments: watch O2 sats. 30L HFNC and Nonrebreather Restrictions Weight Bearing Restrictions: No      Mobility  Bed Mobility Overal bed mobility: Needs Assistance Bed Mobility: Supine to Sit;Sit to Supine     Supine to sit: Supervision Sit to supine: Supervision   General bed mobility comments: Supervision for safety. No increased work of breathing noted. Oxygen sats dropped to 86% on 30L HFNC and nonrebreather. Returned to 92-93% upon return to supine.   Transfers                    Ambulation/Gait                Stairs            Wheelchair Mobility    Modified Rankin (Stroke Patients Only)       Balance Overall balance assessment: Needs assistance Sitting-balance support: No upper extremity supported;Feet supported Sitting balance-Leahy Scale:  Fair                                       Pertinent Vitals/Pain Pain Assessment: No/denies pain    Home Living Family/patient expects to be discharged to:: Private residence Living Arrangements: Alone Available Help at Discharge: Family;Available 24 hours/day Type of Home: House Home Access: Stairs to enter Entrance Stairs-Rails: None Entrance Stairs-Number of Steps: 1 Home Layout: One level Home Equipment: None      Prior Function Level of Independence: Independent         Comments: Per son, pt was independent PTA.      Hand Dominance        Extremity/Trunk Assessment   Upper Extremity Assessment Upper Extremity Assessment: Generalized weakness    Lower Extremity Assessment Lower Extremity Assessment: Generalized weakness    Cervical / Trunk Assessment Cervical / Trunk Assessment: Kyphotic  Communication   Communication: No difficulties  Cognition Arousal/Alertness: Awake/alert Behavior During Therapy: WFL for tasks assessed/performed Overall Cognitive Status: Impaired/Different from baseline Area of Impairment: Orientation;Awareness;Problem solving                 Orientation Level: Disoriented to;Place         Awareness: Emergent Problem Solving: Slow processing General Comments: Pt reports she is at "[facility]" but when asked what kind of facility [facility] was, she reports a school. Slow processing noted.  General Comments General comments (skin integrity, edema, etc.): Pt's son present throughout session     Exercises     Assessment/Plan    PT Assessment Patient needs continued PT services  PT Problem List Cardiopulmonary status limiting activity;Decreased activity tolerance;Decreased balance;Decreased mobility;Decreased cognition;Decreased safety awareness;Decreased knowledge of precautions       PT Treatment Interventions Gait training;DME instruction;Functional mobility training;Therapeutic activities;Therapeutic  exercise;Stair training;Balance training;Patient/family education    PT Goals (Current goals can be found in the Care Plan section)  Acute Rehab PT Goals Patient Stated Goal: none stated PT Goal Formulation: With patient Time For Goal Achievement: 02/02/20 Potential to Achieve Goals: Fair    Frequency Min 3X/week   Barriers to discharge        Co-evaluation               AM-PAC PT "6 Clicks" Mobility  Outcome Measure Help needed turning from your back to your side while in a flat bed without using bedrails?: None Help needed moving from lying on your back to sitting on the side of a flat bed without using bedrails?: A Little Help needed moving to and from a bed to a chair (including a wheelchair)?: A Little Help needed standing up from a chair using your arms (e.g., wheelchair or bedside chair)?: A Little Help needed to walk in hospital room?: A Lot Help needed climbing 3-5 steps with a railing? : Total 6 Click Score: 16    End of Session Equipment Utilized During Treatment: Oxygen Activity Tolerance: Treatment limited secondary to medical complications (Comment) (decreased oxygen sats) Patient left: in bed;with call bell/phone within reach;with nursing/sitter in room;with family/visitor present (on stretcher in ED ) Nurse Communication: Mobility status;Other (comment) (oxygen sats ) PT Visit Diagnosis: Other abnormalities of gait and mobility (R26.89)    Time: 8828-0034 PT Time Calculation (min) (ACUTE ONLY): 12 min   Charges:   PT Evaluation $PT Eval Moderate Complexity: 1 Mod          Reuel Derby, PT, DPT  Acute Rehabilitation Services  Pager: [phone] Office: [phone]   Rudean Hitt 01/19/2020, 2:31 PM

## 2020-01-19 NOTE — Progress Notes (Signed)
Called to patient room for heated HFNC set up. Placed on 30l and 100%. Pt tolerated well. No complications noted.

## 2020-01-19 NOTE — Progress Notes (Signed)
   01/19/20 1338  Assess: MEWS Score  Temp (!) 97.1 F (36.2 C)  BP (!) 187/86  Pulse Rate 78  ECG Heart Rate 78  Resp (!) 32  Level of Consciousness Alert  SpO2 (!) 88 %  O2 Device HFNC;Non-rebreather Mask  Assess: if the MEWS score is Yellow or Red  Were vital signs taken at a resting state? Yes  Focused Assessment No change from prior assessment (from ED assessment)  Early Detection of Sepsis Score *See Row Information* Medium  MEWS guidelines implemented *See Row Information* Yes  Treat  MEWS Interventions Administered scheduled meds/treatments  Pain Scale 0-10  Pain Score 0  Take Vital Signs  Increase Vital Sign Frequency  Yellow: Q 2hr X 2 then Q 4hr X 2, if remains yellow, continue Q 4hrs  Escalate  MEWS: Escalate Yellow: discuss with charge nurse/RN and consider discussing with provider and RRT  Notify: Charge Nurse/RN  Name of Charge Nurse/RN Notified Abigail RN  Date Charge Nurse/RN Notified 01/19/20  Time Charge Nurse/RN Notified 1400  Notify: Provider  Provider Name/Title DR. Pokrel  Date Provider Notified 01/19/20  Time Provider Notified 1400  Notification Type Face-to-face  Notification Reason Other (Comment) (just arrived to floor, sats 88%.)  Response Other (Comment) (order for HFNC heated)  Date of Provider Response 01/19/20  Time of Provider Response 1430  Notify: Rapid Response  Name of Rapid Response RN Notified Helle RN  Date Rapid Response Notified 01/19/20  Time Rapid Response Notified 6015  Document  Patient Outcome Stabilized after interventions  Progress note created (see row info) Yes

## 2020-01-19 NOTE — ED Notes (Signed)
Attempted report 

## 2020-01-19 NOTE — Progress Notes (Signed)
MD informed RN that pt's family (daughter) has been notified about her condition.

## 2020-01-19 NOTE — Progress Notes (Signed)
  Echocardiogram 2D Echocardiogram has been performed.  Randa Lynn Maham Quintin 01/19/2020, 4:18 PM

## 2020-01-19 NOTE — Progress Notes (Signed)
Patient placed on low bed with floor mats due to high fall risk.  Son and patient educated on fall risk.  Bed alarm on and call bell within reach.

## 2020-01-19 NOTE — Significant Event (Signed)
Rapid Response Event Note   Reason for Call :  Hypoxia Patient has recently arrived from the emergency room and assisted from stretcher to the bed. Per staff her O2 sats decreased into the 70s on HFNC at 15L and NRB at 25L Patient with DNR   Initial Focused Assessment:  Upon my arrival she is sitting upright in the bed, mild/moderate work of breathing O2 sats 88% on NRB and 15L HFNC Lung sounds with crackles  Dr Gustavo Lah at bedside, assessed patient and spoke with her son. BP 191/84  HR 78  RR 44  O2 sat 92% on NRB and HFNC each at 15L  Interventions:  Dr Lake Bells at bedside to assess patient 40 mg Lasix IV [place] on Heated Highflow 30L & 100%, with additional NRB, O2 sats 92%   Plan of Care:  RN to call if assistance needed  Event Summary:   MD Notified: Pokhrel Call Time: [place] Time: 2947 End Time: [place]  Raliegh Ip, RN

## 2020-01-19 NOTE — Progress Notes (Signed)
BP dropped 90s/50s from 150s/70s. RN notified MD on call.  Received a call back with order to draw lactic acid and administer NS bolus 200cc.

## 2020-01-19 NOTE — Progress Notes (Signed)
[facility]  Called to bedside emergently Worsening dyspnea Hypoxemia Moved from stretcher to bed 30 minutes ago On exam: new accessory muscle use, crackles bases of lungs, JVD up slightly  Acute respiratory failure with hypoxemia Diffuse parenchymal lung disease Likely admiodarone toxicity Possible cap  Plan Lasix cxr stat Heated high flow  Son updated: I informed him that her condition is guarded and she may not survive this  Cc time 30 min  Roselie Awkward, MD [facility] Pager: [phone] Cell: [phone] If no response, call [phone]

## 2020-01-19 NOTE — ED Notes (Signed)
Report given.

## 2020-01-19 NOTE — Consult Note (Signed)
NAME:  Kristina Bruce, MRN:  [id number], DOB:  10-01-1930, LOS: 1 ADMISSION DATE:  01/11/2020, CONSULTATION DATE:  9/28 REFERRING MD:  Louanne Belton, CHIEF COMPLAINT:  Dyspnea   Brief History   86 y/o female with-year-old fibrillation presented to the [facility] emergency room with acute respiratory failure with hypoxemia on January 18, 2020.  PCCM consulted due to diffuse parenchymal lung disease of undetermined etiology.  History of present illness   This is a pleasant 86 year old female who has a past medical history significant for atrial fibrillation who takes amiodarone for the same who presented to our facility complaining of 3 to 4 months of worsening shortness of breath.  Upon my examination the nurse tells me that the patient had just been confused and had removed her IVs and her oxygen and her O2 saturation dropped to 60%.  Upon my arrival when I asked the patient why she is here she says it is because she has been having nosebleeds.  However after some time of conversation she seems to be clearing up and she tells me that she said shortness of breath for 3 to 4 months with a dry cough.  She has some nausea and weight loss but has not had a productive cough and she denies vomiting.  She denies any leg swelling.  She says her weight has actually gone down in several weeks and she has not gained weight.  She denies any abnormal rash or arthritis symptoms which are new.  No recent fevers.  She denies ever being told that she has a connective tissue disease at baseline. On chart review it shows that she started on amiodarone in April 2021 400 mg daily.  She took this dose for approximately 1 month and then in May 2021 the dose was reduced to 200 daily for 2 weeks then 100 mg daily after that.    Past Medical History  Paroxysmal atrial fibrillation Osteoporosis Hyperlipidemia Hypertension Coronary artery disease Allergic rhinitis  Significant Hospital Events   September 27  admitted  Consults:  Pulmonary  Procedures:  None  Significant Diagnostic Tests:  December 27 CT angiogram chest images independently reviewed showing diffuse bilateral airspace disease, consolidation in a dependent fashion with trace pleural effusions, scattered groundglass  Micro Data:  September 27 SARS-CoV-2 and influenza negative September 27 urine culture September 27 blood culture  Antimicrobials:  September 27 ceftriaxone September 27 azithromycin  Interim history/subjective:    Objective   Blood pressure 138/62, pulse 91, temperature 98.3 F (36.8 C), temperature source Oral, resp. rate (!) 31, height 5\' 5"  (1.651 m), weight 45.4 kg, SpO2 93 %.       No intake or output data in the 24 hours ending 01/19/20 0834 Filed Weights   01/19/20 0557  Weight: 45.4 kg    Examination:  General:  Frail thin female resting in bed HENT: NCAT OP clear PULM: Coarse Crackles bases, normal effort CV: RRR, JVD approximately 7cm ASA, no edema GI: BS+, soft, nontender MSK: normal bulk and tone Neuro: awake, alert, no distress, MAEW   Resolved Hospital Problem list     Assessment & Plan:  86 year old female with a past medical history of atrial fibrillation presents with a subacute onset of shortness of breath associated with hypoxemic respiratory failure and diffuse parenchymal lung disease seen on chest imaging.  In review of chest images from earlier in the year there is no evidence of underlying pulmonary parenchymal abnormalities, she does not smoke, and she does not have significant  occupational exposures.  The differential diagnosis here is broad and could certainly include aspiration, community-acquired pneumonia, atypical viral pneumonia, or organizing pneumonia among various other causes.  She does not have a clinical syndrome which fits with a vasculitis or progressive connective tissue disease.  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   It would be nice to perform a bronchoscopy but she is too frail for that right now.  Plan: Stop amiodarone Continue community-acquired pneumonia treatment with ceftriaxone and azithromycin, would plan for 7 days Start Solu-Medrol 1 mg/kg x3 days, then reduce to prednisone 40 mg daily Wean off O2 Agree with SLP evaluation F/u autoimmune labs sent yesterday  Best practice:   Per [facility]  Labs   CBC: Recent Labs  Lab 01/13/2020 1457 01/12/2020 1614 01/11/2020 1740 01/19/20 0555  WBC 21.7*  --   --  29.3*  NEUTROABS 19.0*  --   --   --   HGB 10.3* 10.5* 9.5* 10.4*  HCT 31.4* 31.0* 28.0* 32.1*  MCV 91.3  --   --  90.7  PLT 473*  --   --  495*    Basic Metabolic Panel: Recent Labs  Lab 12/25/2019 1457 01/16/2020 1614 01/01/2020 1740 01/19/20 0555  NA 128* 127* 129* 129*  K 4.0 4.1 3.9 3.8  CL 95*  --   --  94*  CO2 21*  --   --  24  GLUCOSE 152*  --   --  110*  BUN 13  --   --  10  CREATININE 0.77  --   --  0.53  CALCIUM 7.8*  --   --  8.2*   GFR: Estimated Creatinine Clearance: 35.5 mL/min (by C-G formula based on SCr of 0.53 mg/dL). Recent Labs  Lab 01/05/2020 1457 12/30/2019 1714 01/19/20 0555  PROCALCITON  --   --  0.97  WBC 21.7*  --  29.3*  LATICACIDVEN 3.1* 2.1*  --     Liver Function Tests: Recent Labs  Lab 01/13/2020 1457  AST 42*  ALT 34  ALKPHOS 97  BILITOT 0.9  PROT 5.6*  ALBUMIN 1.8*   No results for input(s): LIPASE, AMYLASE in the last 168 hours. No results for input(s): AMMONIA in the last 168 hours.  ABG    Component Value Date/Time   PHART 7.453 (H) 01/02/2020 1740   PCO2ART 34.4 01/13/2020 1740   PO2ART 64 (L) 01/10/2020 1740   HCO3 24.0 12/26/2019 1740   TCO2 25 01/07/2020 1740   O2SAT 93.0 01/17/2020 1740     Coagulation Profile: No results for input(s): INR, PROTIME in the last 168 hours.  Cardiac Enzymes: No results for input(s): CKTOTAL, CKMB, CKMBINDEX, TROPONINI in the last 168 hours.  HbA1C: No results found for:  HGBA1C  CBG: No results for input(s): GLUCAP in the last 168 hours.   Critical care time: n/a    Roselie Awkward, MD [facility] Pager: [phone] Cell: [phone] If no response, call [phone]

## 2020-01-20 DIAGNOSIS — J9601 Acute respiratory failure with hypoxia: Secondary | ICD-10-CM

## 2020-01-20 DIAGNOSIS — E871 Hypo-osmolality and hyponatremia: Secondary | ICD-10-CM

## 2020-01-20 DIAGNOSIS — D72829 Elevated white blood cell count, unspecified: Secondary | ICD-10-CM

## 2020-01-20 DIAGNOSIS — D649 Anemia, unspecified: Secondary | ICD-10-CM

## 2020-01-20 DIAGNOSIS — G9341 Metabolic encephalopathy: Secondary | ICD-10-CM

## 2020-01-20 LAB — CBC
HCT: 31.6 % — ABNORMAL LOW (ref 36.0–46.0)
Hemoglobin: 10.3 g/dL — ABNORMAL LOW (ref 12.0–15.0)
MCH: 29.3 pg (ref 26.0–34.0)
MCHC: 32.6 g/dL (ref 30.0–36.0)
MCV: 89.8 fL (ref 80.0–100.0)
Platelets: 583 10*3/uL — ABNORMAL HIGH (ref 150–400)
RBC: 3.52 MIL/uL — ABNORMAL LOW (ref 3.87–5.11)
RDW: 13.6 % (ref 11.5–15.5)
WBC: 33.5 10*3/uL — ABNORMAL HIGH (ref 4.0–10.5)
nRBC: 0 % (ref 0.0–0.2)

## 2020-01-20 LAB — COMPREHENSIVE METABOLIC PANEL
ALT: 34 U/L (ref 0–44)
AST: 57 U/L — ABNORMAL HIGH (ref 15–41)
Albumin: 1.8 g/dL — ABNORMAL LOW (ref 3.5–5.0)
Alkaline Phosphatase: 86 U/L (ref 38–126)
Anion gap: 12 (ref 5–15)
BUN: 16 mg/dL (ref 8–23)
CO2: 25 mmol/L (ref 22–32)
Calcium: 8.1 mg/dL — ABNORMAL LOW (ref 8.9–10.3)
Chloride: 96 mmol/L — ABNORMAL LOW (ref 98–111)
Creatinine, Ser: 0.65 mg/dL (ref 0.44–1.00)
GFR calc Af Amer: >60 mL/min (ref >60)
GFR calc non Af Amer: >60 mL/min (ref >60)
Glucose, Bld: 144 mg/dL — ABNORMAL HIGH (ref 70–99)
Potassium: 3.6 mmol/L (ref 3.5–5.1)
Sodium: 133 mmol/L — ABNORMAL LOW (ref 135–145)
Total Bilirubin: 0.4 mg/dL (ref 0.3–1.2)
Total Protein: 5.7 g/dL — ABNORMAL LOW (ref 6.5–8.1)

## 2020-01-20 LAB — URINE CULTURE: Culture: NO GROWTH

## 2020-01-20 LAB — MAGNESIUM: Magnesium: 1.9 mg/dL (ref 1.7–2.4)

## 2020-01-20 LAB — PROCALCITONIN: Procalcitonin: 1.29 ng/mL

## 2020-01-20 LAB — LEGIONELLA PNEUMOPHILA SEROGP 1 UR AG: L. pneumophila Serogp 1 Ur Ag: NEGATIVE

## 2020-01-20 LAB — LACTIC ACID, PLASMA
Lactic Acid, Venous: 1.8 mmol/L (ref 0.5–1.9)
Lactic Acid, Venous: 1.8 mmol/L (ref 0.5–1.9)

## 2020-01-20 LAB — STREP PNEUMONIAE URINARY ANTIGEN: Strep Pneumo Urinary Antigen: NEGATIVE

## 2020-01-20 LAB — RHEUMATOID FACTOR: Rheumatoid fact SerPl-aCnc: 15.2 IU/mL — ABNORMAL HIGH (ref 0.0–13.9)

## 2020-01-20 LAB — PHOSPHORUS: Phosphorus: 3.9 mg/dL (ref 2.5–4.6)

## 2020-01-20 MED ORDER — MORPHINE 100MG IN NS 100ML (1MG/ML) PREMIX INFUSION
0.0000 mg/h | INTRAVENOUS | Status: DC
  Administered 2020-01-20: 5 mg/h via INTRAVENOUS
  Filled 2020-01-20: qty 100

## 2020-01-20 MED ORDER — GLYCOPYRROLATE 0.2 MG/ML IJ SOLN: 0.2000 mg | INTRAMUSCULAR | Status: DC | PRN

## 2020-01-20 MED ORDER — POLYVINYL ALCOHOL 1.4 % OP SOLN
1.0000 [drp] | Freq: Four times a day (QID) | OPHTHALMIC | Status: DC | PRN
  Filled 2020-01-20: qty 15

## 2020-01-20 MED ORDER — ACETAMINOPHEN 650 MG RE SUPP: 650.0000 mg | Freq: Four times a day (QID) | RECTAL | Status: DC | PRN

## 2020-01-20 MED ORDER — ACETAMINOPHEN 325 MG PO TABS: 650.0000 mg | ORAL_TABLET | Freq: Four times a day (QID) | ORAL | Status: DC | PRN

## 2020-01-20 MED ORDER — MORPHINE SULFATE (PF) 2 MG/ML IV SOLN: 2.0000 mg | INTRAVENOUS | Status: DC | PRN

## 2020-01-20 MED ORDER — POTASSIUM CHLORIDE IN NACL 20-0.9 MEQ/L-% IV SOLN
INTRAVENOUS | Status: DC
  Filled 2020-01-20: qty 1000

## 2020-01-20 MED ORDER — DEXTROSE 5 % IV SOLN: INTRAVENOUS | Status: DC

## 2020-01-20 MED ORDER — MORPHINE SULFATE (PF) 2 MG/ML IV SOLN
2.0000 mg | INTRAVENOUS | Status: DC | PRN
  Administered 2020-01-20: 2 mg via INTRAVENOUS
  Filled 2020-01-20: qty 1

## 2020-01-20 MED ORDER — DIPHENHYDRAMINE HCL 50 MG/ML IJ SOLN: 25.0000 mg | INTRAMUSCULAR | Status: DC | PRN

## 2020-01-20 MED ORDER — GLYCOPYRROLATE 1 MG PO TABS
1.0000 mg | ORAL_TABLET | ORAL | Status: DC | PRN
  Filled 2020-01-20: qty 1

## 2020-01-20 MED ORDER — VANCOMYCIN HCL 500 MG/100ML IV SOLN
500.0000 mg | INTRAVENOUS | Status: DC
  Administered 2020-01-20: 500 mg via INTRAVENOUS
  Filled 2020-01-20 (×2): qty 100

## 2020-01-20 MED ORDER — MORPHINE BOLUS VIA INFUSION
5.0000 mg | INTRAVENOUS | Status: DC | PRN
  Filled 2020-01-20: qty 5

## 2020-01-21 ENCOUNTER — Ambulatory Visit: Payer: Self-pay | Admitting: Nurse Practitioner

## 2020-01-21 LAB — QUANTIFERON-TB GOLD PLUS: QuantiFERON-TB Gold Plus: UNDETERMINED — AB

## 2020-01-21 LAB — ANA W/REFLEX IF POSITIVE

## 2020-01-21 LAB — QUANTIFERON-TB GOLD PLUS (RQFGPL)
QuantiFERON Mitogen Value: 0.15 IU/mL
QuantiFERON Nil Value: 0.03 IU/mL
QuantiFERON TB1 Ag Value: 0.04 IU/mL
QuantiFERON TB2 Ag Value: 0.03 IU/mL

## 2020-01-21 LAB — MYCOPLASMA PNEUMONIAE ANTIBODY, IGM: Mycoplasma pneumo IgM: <770 U/mL (ref 0–769)

## 2020-01-22 NOTE — Progress Notes (Signed)
Initial Nutrition Assessment  DOCUMENTATION CODES:   Underweight  INTERVENTION:   When able to safely take POs, recommend regular diet with no dietary restrictions, unless texture/liquid modifications needed. Continue Ensure Enlive po BID, each supplement provides 350 kcal and 20 grams of protein.  NUTRITION DIAGNOSIS:   Inadequate oral intake related to lethargy/confusion as evidenced by meal completion < 50%.  GOAL:   Patient will meet greater than or equal to 90% of their needs  MONITOR:   PO intake, Supplement acceptance  REASON FOR ASSESSMENT:   Consult Assessment of nutrition requirement/status  ASSESSMENT:   86 yo female admitted with worsening SOB. PMH includes PAF, HTN, CHF, anxiety, depression.  Per H&P, patient has had decreased appetite and lost 12 lbs since earlier this year. Usual weights reviewed. Documented weights range from 44.9-49.9 kg since February 2021. Suspect patient is malnourished with recent decreased appetite, decreased intake, and BMI=16.6. Unable to obtain enough information at this time for identification of malnutrition.   Patient is currently requiring high flow nasal cannula and NRB mask. Patient has been agitated and confused this morning, not following commands and removing her oxygen mask. She is requiring full supervision at all times.  Swallow evaluation has been ordered. SLP unable to complete evaluation 9/28 due to respiratory status, RN was holding POs. Current diet order is heart healthy with Ensure Enlive BID, patient is not taking any POs.   Labs reviewed. Na 133  Medications reviewed and include Oscal with D, Solumedrol, Remeron.   NUTRITION - FOCUSED PHYSICAL EXAM:  unable to complete  Diet Order:   Diet Order            Diet Heart Room service appropriate? Yes; Fluid consistency: Thin  Diet effective now                 EDUCATION NEEDS:   Not appropriate for education at this time  Skin:  Skin Assessment:  Reviewed RN Assessment  Last BM:  no BM documented since admission  Height:   Ht Readings from Last 1 Encounters:  01/19/20 5\' 5"  (1.651 m)    Weight:   Wt Readings from Last 1 Encounters:  01/19/20 45.4 kg    Ideal Body Weight:  56.8 kg  BMI:  Body mass index is 16.64 kg/m.  Estimated Nutritional Needs:   Kcal:  1400-1600  Protein:  70-80 gm  Fluid:  >/= 1.5 L    Lucas Mallow, RD, LDN, CNSC Please refer to Amion for contact information.

## 2020-01-22 NOTE — Progress Notes (Signed)
This patient is extremely agitated and cannot follow commands. This pt requires constant redirection and cannot be left unsupervised for any reason. Pt is a very high fall risk and non compliant. Pt continues to remove high flow o2 mask

## 2020-01-22 NOTE — Progress Notes (Signed)
PROGRESS NOTE    Kristina Bruce   KKX:[id number]  DOB: Aug 03, 1930  DOA: 01/07/2020 PCP: Jenean Lindau, MD   Brief Narrative:  Kristina Bruce is an 86 y/o F with PAF on Amiodarone and Eliquis, chronic diastolic CHF, HTN, Anxiety, depression and recently noted poor memory who lives alone. She is brought in by EMS as family noted dyspnea. The patient admits to progressive dyspnea over months. She saw her PCP on 9/24 for nose bleeds, dry cough, nausea poor appetite and weight loss. COVID test was negative. She was recommended to continue Remeron and dietary supplements. No h/o aspiration or vomiting. EMS placed the patient on 15 L O2 as pulse ox was 50%.   In the ED CT chest w/o contrast > -  Multifocal and diffuse airspace disease with areas of ground-glass, consolidation, as well as septal thickening. There is a basilar predominant distribution. - Small pleural effusions. - Cardiomegaly with small pericardial effusion. Coronary artery calcifications. - Mildly enlarged lower paratracheal lymph node is likely reactive.  Pulm consulted in ED > Rheum labs and sputum culture ordered. Amio stopped and Solumedrol star4ted. SLP eval ordered and antibiotics started for CAP. Hold off on Bronch as she is frail  Straight cath by RN around 10:30 PM> 600 cc of urine.  Patient quite confused in the hospital and removing IVs. She later fell out of bed.   2 D ECHO 9/28: Left ventricular ejection fraction, by estimation, is 65 to 70%. The left ventricle has normal function. The left ventricle has no regional wall motion abnormalities. Left ventricular diastolic parameters are consistent with Grade I diastolic dysfunction (impaired relaxation). 2. Right ventricular systolic function is normal. The right ventricular size is normal. There is normal pulmonary artery systolic pressure. The estimated right ventricular systolic pressure is 16.9 mmHg. 3. The pericardial effusion is circumferential.  (trivial) 4. The mitral valve is abnormal. Trivial mitral valve regurgitation. 5. The aortic valve is tricuspid. Aortic valve regurgitation is mild. Mild aortic valve sclerosis is present, with no evidence of aortic valve stenosis. 6. The inferior vena cava is normal in size with greater than 50% respiratory variability, suggesting right atrial pressure of 3 mmHg. Left Ventricle: Left ventricular ejection fraction, by estimation, is 65 to 70  Subjective: Patient has not waking at this time    Assessment & Plan:   Principal Problem:   Acute respiratory failure with hypoxia -Currently on 30 L/min of oxygen -Possibly amiodarone toxicity versus autoimmune versus infection-currently receiving IV steroids and IV antibiotics -Follow-up rheumatological panel -2D echo does not suggest that she is fluid overloaded -Per message communicated to me, the patient's family will pursue comfort care if she does not appear to be improving by tomorrow  Active Problems:   PAF (paroxysmal atrial fibrillation) -Continue Eliquis-amiodarone discontinued    Acute metabolic encephalopathy -Secondary to acute illness - She was having short-term memory loss at home which would suggest that she was beginning to develop dementia  Hypertension - Continue Avapro    Hyponatremia/dehydration -Poor oral intake at home-start slow normal saline-    Leukocytosis -Acute infection versus acute inflammation-now that she is on steroids, her WBC count might rise further  Anemia of chronic disease  Time spent in minutes: 35 DVT prophylaxis: apixaban (ELIQUIS) tablet 2.5 mg Start: 01/14/2020 2200 apixaban (ELIQUIS) tablet 2.5 mg  Code Status: DO NOT RESUSCITATE Family Communication: Daughter at bedside Disposition Plan:  Status is: Inpatient  Remains inpatient appropriate because:Treating acute respiratory failure with IV medications  Dispo: The patient is from: Home              Anticipated d/c is to: To be  determined              Anticipated d/c date is: > 3 days              Patient currently is not medically stable to d/c.       Consultants:   Pulmonary critical care Procedures:   2D echo Antimicrobials:  Anti-infectives (From admission, onward)   Start     Dose/Rate Route Frequency Ordered Stop   01/26/20 0030  vancomycin (VANCOREADY) IVPB 500 mg/100 mL        500 mg 100 mL/hr over 60 Minutes Intravenous Every 24 hours 2020/01/26 0028     01/19/20 1600  cefTRIAXone (ROCEPHIN) 2 g in sodium chloride 0.9 % 100 mL IVPB        2 g 200 mL/hr over 30 Minutes Intravenous Every 24 hours 01/19/20 0848     01/19/20 1600  azithromycin (ZITHROMAX) 500 mg in sodium chloride 0.9 % 250 mL IVPB        500 mg 250 mL/hr over 60 Minutes Intravenous Every 24 hours 01/19/20 0848     01/19/20 1000  cefTRIAXone (ROCEPHIN) 1 g in sodium chloride 0.9 % 100 mL IVPB  Status:  Discontinued        1 g 200 mL/hr over 30 Minutes Intravenous Every 24 hours 12/31/2019 1817 12/26/2019 1831   01/19/20 1000  azithromycin (ZITHROMAX) tablet 500 mg  Status:  Discontinued        500 mg Oral Daily 01/04/2020 1817 12/28/2019 1831   12/26/2019 1545  cefTRIAXone (ROCEPHIN) 1 g in sodium chloride 0.9 % 100 mL IVPB        1 g 200 mL/hr over 30 Minutes Intravenous  Once 01/19/2020 1535 01/01/2020 1646   12/30/2019 1545  azithromycin (ZITHROMAX) 500 mg in sodium chloride 0.9 % 250 mL IVPB        500 mg 250 mL/hr over 60 Minutes Intravenous  Once 12/27/2019 1535 01/12/2020 1731       Objective: Vitals:   January 26, 2020 0118 01-26-20 0634 2020-01-26 0743 01-26-20 0757  BP: 130/69 (!) 146/76  (!) 119/97  Pulse: 86 91  72  Resp: 19 (!) 21  20  Temp:  97.9 F (36.6 C)  97.8 F (36.6 C)  TempSrc:  Oral  Axillary  SpO2: 97% 90% 93% 97%  Weight:      Height:        Intake/Output Summary (Last 24 hours) at 2020/01/26 0856 Last data filed at 01/19/2020 2027 Gross per 24 hour  Intake 395.5 ml  Output 500 ml  Net -104.5 ml   Filed Weights    01/19/20 0557  Weight: 45.4 kg    Examination: General exam: Appears comfortable  HEENT: PERRL Respiratory system: Clear to auscultation.  Respiratory rate in 20s Cardiovascular system: S1 & S2 heard, RRR.   Gastrointestinal system: Abdomen soft, non-tender, nondistended. Normal bowel sounds. Central nervous system: Sleeping . Extremities: No cyanosis, clubbing or edema Skin: No rashes or ulcers Psychiatry: Resting peacefully    Data Reviewed: I have personally reviewed following labs and imaging studies  CBC: Recent Labs  Lab 01/16/2020 1457 12/31/2019 1614 01/08/2020 1740 01/19/20 0555 January 26, 2020 0043  WBC 21.7*  --   --  29.3* 33.5*  NEUTROABS 19.0*  --   --   --   --   HGB 10.3*  10.5* 9.5* 10.4* 10.3*  HCT 31.4* 31.0* 28.0* 32.1* 31.6*  MCV 91.3  --   --  90.7 89.8  PLT 473*  --   --  495* 284*   Basic Metabolic Panel: Recent Labs  Lab 12/29/2019 1457 01/01/2020 1614 01/13/2020 1740 01/19/20 0555 Feb 10, 2020 0043  NA 128* 127* 129* 129* 133*  K 4.0 4.1 3.9 3.8 3.6  CL 95*  --   --  94* 96*  CO2 21*  --   --  24 25  GLUCOSE 152*  --   --  110* 144*  BUN 13  --   --  10 16  CREATININE 0.77  --   --  0.53 0.65  CALCIUM 7.8*  --   --  8.2* 8.1*  MG  --   --   --   --  1.9  PHOS  --   --   --   --  3.9   GFR: Estimated Creatinine Clearance: 35.5 mL/min (by C-G formula based on SCr of 0.65 mg/dL). Liver Function Tests: Recent Labs  Lab 01/10/2020 1457 02-10-20 0043  AST 42* 57*  ALT 34 34  ALKPHOS 97 86  BILITOT 0.9 0.4  PROT 5.6* 5.7*  ALBUMIN 1.8* 1.8*   No results for input(s): LIPASE, AMYLASE in the last 168 hours. No results for input(s): AMMONIA in the last 168 hours. Coagulation Profile: No results for input(s): INR, PROTIME in the last 168 hours. Cardiac Enzymes: No results for input(s): CKTOTAL, CKMB, CKMBINDEX, TROPONINI in the last 168 hours. BNP (last 3 results) No results for input(s): PROBNP in the last 8760 hours. HbA1C: No results for input(s):  HGBA1C in the last 72 hours. CBG: No results for input(s): GLUCAP in the last 168 hours. Lipid Profile: No results for input(s): CHOL, HDL, LDLCALC, TRIG, CHOLHDL, LDLDIRECT in the last 72 hours. Thyroid Function Tests: No results for input(s): TSH, T4TOTAL, FREET4, T3FREE, THYROIDAB in the last 72 hours. Anemia Panel: No results for input(s): VITAMINB12, FOLATE, FERRITIN, TIBC, IRON, RETICCTPCT in the last 72 hours. Urine analysis:    Component Value Date/Time   COLORURINE AMBER (A) 01/19/2020 2246   APPEARANCEUR HAZY (A) 01/17/2020 2246   LABSPEC 1.020 12/26/2019 2246   PHURINE 5.0 01/11/2020 2246   GLUCOSEU NEGATIVE 01/01/2020 2246   HGBUR NEGATIVE 01/04/2020 2246   BILIRUBINUR NEGATIVE 01/06/2020 2246   BILIRUBINUR Negative 01/15/2020 1701   KETONESUR NEGATIVE 01/07/2020 2246   PROTEINUR 30 (A) 01/08/2020 2246   UROBILINOGEN 1.0 01/15/2020 1701   UROBILINOGEN 0.2 01/18/2007 1358   NITRITE NEGATIVE 01/07/2020 2246   LEUKOCYTESUR NEGATIVE 01/01/2020 2246   Sepsis Labs: @LABRCNTIP (procalcitonin:4,lacticidven:4) ) Recent Results (from the past 240 hour(s))  SARS-COV-2 RNA,(COVID-19) QUAL NAAT     Status: None   Collection Time: 01/15/20  5:26 PM  Result Value Ref Range Status   SARS CoV2 RNA Not Detected Not Detect Final    Comment: . A Not Detected (negative) test result for this test means that SARS-CoV-2 RNA was not present in the specimen above the limit of detection. A negative result does not rule out the possibility of COVID-19 and should not be used as the sole basis for treatment or patient management decisions.  If COVID-19 is still suspected, based on exposure history together with other clinical findings, re-testing should be considered in consultation with public health authorities. Laboratory test results should always be considered in the context of clinical observations and epidemiological data in making a final diagnosis and patient management  decisions. . Please review the "Fact Sheets" and FDA authorized labeling available for health care providers and patients using the following websites: [email or web address]://[email or web address]/ [facility]/fact-sheet [email or web address]://www.[email or web address]/home/[email or web address]-19/ Patients/[facility]LDT/fact-sheet.html . This test has been authorized  by the FDA under an Emergency Use Authorization (EUA) for use by authorized laboratories. . Due to the current public health emergency, [facility] is receiving a high volume of samples from a wide variety of swabs and media for COVID-19 testing. In order to serve patients during this public health crisis, samples from appropriate clinical sources are being tested. Negative test results derived from specimens received in non-commercially manufactured viral collection and transport media, or in media and sample collection kits not yet authorized by FDA for COVID-19 testing should be cautiously evaluated and the patient potentially subjected to extra precautions such as additional clinical monitoring, including collection of an additional specimen. . Methodology:  Nucleic Acid Amplification Test (NAAT) includes RT-PCR or TMA . Additional information about COVID-19 can be found at the [employer] website: www.[email or web address].com/[email or web address]   Respiratory Panel by RT PCR (Flu A&B, Covid) - Nasopharyngeal Swab     Status: None   Collection Time: 12/25/2019  2:57 PM   Specimen: Nasopharyngeal Swab  Result Value Ref Range Status   SARS Coronavirus 2 by RT PCR NEGATIVE NEGATIVE Final    Comment: (NOTE) SARS-CoV-2 target nucleic acids are NOT DETECTED.  The SARS-CoV-2 RNA is generally detectable in upper respiratoy specimens during the acute phase of infection. The lowest concentration of SARS-CoV-2 viral copies this assay can detect is 131 copies/mL. A negative result does not preclude SARS-Cov-2 infection and should not be used as the  sole basis for treatment or other patient management decisions. A negative result may occur with  improper specimen collection/handling, submission of specimen other than nasopharyngeal swab, presence of viral mutation(s) within the areas targeted by this assay, and inadequate number of viral copies (<131 copies/mL). A negative result must be combined with clinical observations, patient history, and epidemiological information. The expected result is Negative.  Fact Sheet for Patients:  PinkCheek.be  Fact Sheet for Healthcare Providers:  GravelBags.it  This test is no t yet approved or cleared by the [place] FDA and  has been authorized for detection and/or diagnosis of SARS-CoV-2 by FDA under an Emergency Use Authorization (EUA). This EUA will remain  in effect (meaning this test can be used) for the duration of the COVID-19 declaration under Section 564(b)(1) of the Act, 21 U.S.C. section 360bbb-3(b)(1), unless the authorization is terminated or revoked sooner.     Influenza A by PCR NEGATIVE NEGATIVE Final   Influenza B by PCR NEGATIVE NEGATIVE Final    Comment: (NOTE) The Xpert Xpress SARS-CoV-2/FLU/RSV assay is intended as an aid in  the diagnosis of influenza from Nasopharyngeal swab specimens and  should not be used as a sole basis for treatment. Nasal washings and  aspirates are unacceptable for Xpert Xpress SARS-CoV-2/FLU/RSV  testing.  Fact Sheet for Patients: PinkCheek.be  Fact Sheet for Healthcare Providers: GravelBags.it  This test is not yet approved or cleared by the [place] FDA and  has been authorized for detection and/or diagnosis of SARS-CoV-2 by  FDA under an Emergency Use Authorization (EUA). This EUA will remain  in effect (meaning this test can be used) for the duration of the  Covid-19 declaration under Section 564(b)(1) of  the Act, 21  U.S.C. section 360bbb-3(b)(1), unless the authorization is  terminated or revoked. Performed at [facility], [address] [place],  [place] [address]   Culture, blood (routine x 2)     Status: None (Preliminary result)   Collection Time: 01/07/2020  2:57 PM   Specimen: BLOOD LEFT ARM  Result Value Ref Range Status   Specimen Description BLOOD LEFT ARM  Final   Special Requests   Final    BOTTLES DRAWN AEROBIC AND ANAEROBIC Blood Culture adequate volume   Culture   Final    NO GROWTH 2 DAYS Performed at [facility], [address] [place], [place] [address]    Report Status PENDING  Incomplete  Culture, blood (routine x 2)     Status: None (Preliminary result)   Collection Time: 01/09/2020  3:41 PM   Specimen: BLOOD  Result Value Ref Range Status   Specimen Description BLOOD UPPER LEFT  Final   Special Requests   Final    BOTTLES DRAWN AEROBIC AND ANAEROBIC Blood Culture results may not be optimal due to an inadequate volume of blood received in culture bottles   Culture   Final    NO GROWTH 2 DAYS Performed at [facility], [place] [address] [place], [place] [address]    Report Status PENDING  Incomplete         Radiology Studies: DG Chest 1 View  Result Date: 01/19/2020 CLINICAL DATA:  Pneumonia EXAM: CHEST  1 VIEW COMPARISON:  01/17/2020 FINDINGS: Stable cardiomediastinal contours. Extensive bilateral airspace opacities throughout both lungs, right worse than left. Findings are similar to slightly progressed compared to the prior study. Small bilateral pleural effusions. No pneumothorax. IMPRESSION: Extensive bilateral airspace opacities, right worse than left, similar to slightly progressed compared to prior. Small bilateral pleural effusions. Electronically Signed   By: Davina Poke D.O.   On: 01/19/2020 15:59   DG Pelvis 1-2 Views  Result Date: 01/19/2020 CLINICAL DATA:  Fall. EXAM: PELVIS - 1-2 VIEW COMPARISON:  Left hip radiograph  11/30/2017 FINDINGS: The cortical margins of the bony pelvis are intact. The bones are under mineralized. No fracture. Pubic symphysis and sacroiliac joints are congruent. Both femoral heads are well-seated in the respective acetabula. Pubic rami appear intact. IMPRESSION: No pelvic fracture. Electronically Signed   By: Keith Rake M.D.   On: 01/19/2020 21:44   CT CHEST WO CONTRAST  Result Date: 12/25/2019 CLINICAL DATA:  Pneumonia, unresolved EXAM: CT CHEST WITHOUT CONTRAST TECHNIQUE: Multidetector CT imaging of the chest was performed following the standard protocol without IV contrast. COMPARISON:  Radiograph 01/17/2020.  Radiograph 08/05/2019. FINDINGS: Cardiovascular: Aortic atherosclerosis. No aortic aneurysm. There is cardiomegaly. Small pericardial effusion measures up to 8 mm in depth anteriorly. Fluid in the superior pericardial recesses. There are coronary artery calcifications. Mediastinum/Nodes: Lack of contrast and paucity of mediastinal fat limits assessment for adenopathy. Mildly enlarged lower paratracheal node measuring 11 mm, series 3, image 46. No evidence of esophageal wall thickening. No suspicious thyroid nodule. Lungs/Pleura: Multifocal and diffuse airspace disease with areas of ground-glass, consolidation, as well as septal thickening. There is a basilar predominant distribution. Small pleural effusions with fluid tracking into the right minor fissure. No evidence of high attenuation opacities. There are scattered calcified granulomas in the right lower lobe. Upper Abdomen: No acute or unexpected findings. Musculoskeletal: There are no acute or suspicious osseous abnormalities. Prominent Schmorl's node in superior endplate of L2. IMPRESSION: 1. Multifocal and diffuse airspace disease with areas of ground-glass, consolidation, as well as septal thickening. There is a basilar predominant distribution. Differential considerations include multifocal pneumonia, including atypical  organisms or  COVID 19, diffuse alveolar damage, organizing pneumonia, or potentially amiodarone toxicity, there is no high-density consolidation. 2. Small pleural effusions. 3. Cardiomegaly with small pericardial effusion. Coronary artery calcifications. 4. Mildly enlarged lower paratracheal lymph node is likely reactive. Aortic Atherosclerosis (ICD10-I70.0). Electronically Signed   By: Keith Rake M.D.   On: 01/01/2020 18:47   DG CHEST PORT 1 VIEW  Result Date: 01/19/2020 CLINICAL DATA:  Acute hypercapnic respiratory failure. EXAM: PORTABLE CHEST 1 VIEW COMPARISON:  Chest radiograph from the same day FINDINGS: There is extensive bilateral interstitial and airspace opacification, similar to prior from the same day. Small left pleural effusion is suspected. No discernible pneumothorax. Cardiac and mediastinal contours are largely obscured no evidence of acute osseous abnormality. IMPRESSION: Extensive bilateral interstitial and airspace opacification, similar to prior from the same day and increased from 12/23/2019. Findings could reflect multifocal pneumonia and/or pulmonary edema. Electronically Signed   By: Margaretha Sheffield MD   On: 01/19/2020 15:06   DG Chest Portable 1 View  Result Date: 12/26/2019 CLINICAL DATA:  Shortness of breath and lethargy. EXAM: PORTABLE CHEST 1 VIEW COMPARISON:  August 05, 2019 FINDINGS: The lungs are hyperinflated. Marked severity bilateral infiltrates are seen. This involves most of the right lung, the mid left lung and the left lung base. A small left pleural effusion is noted. No pneumothorax is identified. The heart size and mediastinal contours are within normal limits. The visualized skeletal structures are unremarkable. IMPRESSION: 1. Marked severity bilateral infiltrates. 2. Small left pleural effusion. Electronically Signed   By: Virgina Norfolk M.D.   On: 01/01/2020 15:36   ECHOCARDIOGRAM COMPLETE  Result Date: 01/19/2020    ECHOCARDIOGRAM REPORT   Patient  Name:   Kristina Bruce Date of Exam: 01/19/2020 Medical Rec #:  [id number]       Height:       65.0 in Accession #:    [id number]      Weight:       100.0 lb Date of Birth:  03-14-1931       BSA:          1.473 m Patient Age:    82 years        BP:           186/103 mmHg Patient Gender: F               HR:           82 bpm. Exam Location:  Inpatient Procedure: 2D Echo, Cardiac Doppler and Color Doppler Indications:    Dyspnea 786.09 / R06.00  History:        Patient has prior history of Echocardiogram examinations, most                 recent 03/10/2019. Arrythmias:Atrial Fibrillation,                 Signs/Symptoms:Syncope; Risk Factors:Hypertension and                 Dyslipidemia.  Sonographer:    Jonelle Sidle Dance Referring Phys: [id number] [place]  1. Left ventricular ejection fraction, by estimation, is 65 to 70%. The left ventricle has normal function. The left ventricle has no regional wall motion abnormalities. Left ventricular diastolic parameters are consistent with Grade I diastolic dysfunction (impaired relaxation).  2. Right ventricular systolic function is normal. The right ventricular size is normal. There is normal pulmonary artery systolic pressure. The estimated right ventricular systolic pressure is 38.1 mmHg.  3.  The pericardial effusion is circumferential.  4. The mitral valve is abnormal. Trivial mitral valve regurgitation.  5. The aortic valve is tricuspid. Aortic valve regurgitation is mild. Mild aortic valve sclerosis is present, with no evidence of aortic valve stenosis.  6. The inferior vena cava is normal in size with greater than 50% respiratory variability, suggesting right atrial pressure of 3 mmHg. FINDINGS  Left Ventricle: Left ventricular ejection fraction, by estimation, is 65 to 70%. The left ventricle has normal function. The left ventricle has no regional wall motion abnormalities. The left ventricular internal cavity size was normal in size. There is  no left  ventricular hypertrophy. Left ventricular diastolic parameters are consistent with Grade I diastolic dysfunction (impaired relaxation). Indeterminate filling pressures. Right Ventricle: The right ventricular size is normal. No increase in right ventricular wall thickness. Right ventricular systolic function is normal. There is normal pulmonary artery systolic pressure. The tricuspid regurgitant velocity is 2.86 m/s, and  with an assumed right atrial pressure of 3 mmHg, the estimated right ventricular systolic pressure is 69.6 mmHg. Left Atrium: Left atrial size was normal in size. Right Atrium: Right atrial size was normal in size. Pericardium: Trivial pericardial effusion is present. The pericardial effusion is circumferential. Mitral Valve: The mitral valve is abnormal. There is mild thickening of the mitral valve leaflet(s). Trivial mitral valve regurgitation. Tricuspid Valve: The tricuspid valve is grossly normal. Tricuspid valve regurgitation is mild. Aortic Valve: The aortic valve is tricuspid. Aortic valve regurgitation is mild. Aortic regurgitation PHT measures 392 msec. Mild aortic valve sclerosis is present, with no evidence of aortic valve stenosis. Pulmonic Valve: The pulmonic valve was normal in structure. Pulmonic valve regurgitation is not visualized. Aorta: The aortic root and ascending aorta are structurally normal, with no evidence of dilitation. Venous: The inferior vena cava is normal in size with greater than 50% respiratory variability, suggesting right atrial pressure of 3 mmHg. IAS/Shunts: No atrial level shunt detected by color flow Doppler.  LEFT VENTRICLE PLAX 2D LVIDd:         4.10 cm LVIDs:         2.40 cm LV PW:         0.90 cm LV IVS:        0.90 cm LVOT diam:     1.60 cm LV SV:         42 LV SV Index:   28 LVOT Area:     2.01 cm  RIGHT VENTRICLE             IVC RV Basal diam:  2.50 cm     IVC diam: 1.20 cm RV S prime:     13.40 cm/s TAPSE (M-mode): 2.7 cm LEFT ATRIUM              Index       RIGHT ATRIUM           Index LA diam:        3.40 cm 2.31 cm/m  RA Area:     14.90 cm LA Vol (A2C):   43.9 ml 29.80 ml/m RA Volume:   33.70 ml  22.87 ml/m LA Vol (A4C):   16.3 ml 11.06 ml/m LA Biplane Vol: 25.7 ml 17.44 ml/m  AORTIC VALVE LVOT Vmax:   105.00 cm/s LVOT Vmean:  67.200 cm/s LVOT VTI:    0.207 m AI PHT:      392 msec  AORTA Ao Root diam: 3.20 cm Ao Asc diam:  3.50 cm MITRAL VALVE  TRICUSPID VALVE MV Area (PHT): 2.73 cm    TR Peak grad:   32.7 mmHg MV Decel Time: 278 msec    TR Vmax:        286.00 cm/s MV E velocity: 60.20 cm/s MV A velocity: 87.50 cm/s  SHUNTS MV E/A ratio:  0.69        Systemic VTI:  0.21 m                            Systemic Diam: 1.60 cm Lyman Bishop MD Electronically signed by Lyman Bishop MD Signature Date/Time: 01/19/2020/4:12:17 PM    Final    US Abdomen Limited RUQ  Result Date: 01/17/2020 CLINICAL DATA:  Elevation of LFTs EXAM: ULTRASOUND ABDOMEN LIMITED RIGHT UPPER QUADRANT COMPARISON:  CT chest 01/06/2020, CT abdomen pelvis 06/07/2017 FINDINGS: Gallbladder: No visible gallstones or significant gallbladder wall thickening however patient was unable to reposition for decubitus imaging. A small amount of pericholecystic fluid is present. Sonographic Percell Miller sign is negative. Common bile duct: Diameter: 3.7 mm, nondilated. Liver: Questionable micro nodularity of the liver surface. No focal lesion identified. Within normal limits in parenchymal echogenicity. Portal vein is patent on color Doppler imaging with normal direction of blood flow towards the liver. Other: Incidental note made of a right pleural effusion. IMPRESSION: Question some mild micro nodularity of the hepatic surface contour. Could be seen in the setting of intrinsic liver disease/cirrhosis or hepatic congestion. Trace amount of pericholecystic fluid without visible cholelithiasis, gallbladder wall thickening or sonographic Murphy sign. Finding is nonspecific, particularly in  the setting of suspected intrinsic liver disease. Correlate with exam findings and if there is persisting clinical concern for acute cholecystitis, HIDA could be obtained. Incidentally noted right pleural effusion. Electronically Signed   By: Lovena Le M.D.   On: 01/10/2020 21:48      Scheduled Meds:  apixaban  2.5 mg Oral BID   calcium-vitamin D  1 tablet Oral Q breakfast   feeding supplement (ENSURE ENLIVE)  237 mL Oral BID BM   guaiFENesin  600 mg Oral BID   ipratropium  0.5 mg Nebulization Q6H   irbesartan  150 mg Oral Daily   loratadine  10 mg Oral Daily   mouth rinse  15 mL Mouth Rinse BID   methylPREDNISolone (SOLU-MEDROL) injection  60 mg Intravenous Daily   metoprolol succinate  50 mg Oral Daily   mirtazapine  7.5 mg Oral QHS   Continuous Infusions:  azithromycin Stopped (01/19/20 1849)   cefTRIAXone (ROCEPHIN)  IV Stopped (01/19/20 1634)   vancomycin 500 mg (2020/02/19 0131)     LOS: 2 days      Debbe Odea, MD [facility] Pager: [email or web address] 19-Feb-2020, 8:56 AM

## 2020-01-22 NOTE — Death Summary Note (Signed)
Death Summary  Kristina Bruce LSL:[id number] DOB: 12/30/1930 DOA: 02/03/20  PCP: Jenean Lindau, MD  Admit date: 02-03-20 Date of Death: 02/05/20  Final Diagnoses:  Principal Problem:   Acute respiratory failure with hypoxia ([place]) Active Problems:   PAF (paroxysmal atrial fibrillation) (H[facility])   Acute metabolic encephalopathy   Hyponatremia   Leukocytosis   Anemia    History of present illness:  Kristina Bruce is an 86 y/o F with PAF on Amiodarone and Eliquis, chronic diastolic CHF, HTN, Anxiety, depression and recently noted poor memory who lives alone. She is brought in by EMS as family noted dyspnea. The patient admits to progressive dyspnea over months. She saw her PCP on 9/24 for nose bleeds, dry cough, nausea poor appetite and weight loss. COVID test was negative. She was recommended to continue Remeron and dietary supplements. On day of admission, [name] was having increased SOB at home and was hypoxic with O2 sat of 50%.  Hospital Course:  [name] was admitted with acute respiratory failure with hypoxia. She continued to require high oxygen requirement on heated high flow oxygen. PCCM was consulted on 02-05-2020 with persistent respiratory failure and hypoxia.  She was being treated with antibiotic to cover for possible pneumonia.  She did not have any improvement in respiratory status with antibiotic therapy.  Treated with steroids from time of admission.  Differential diagnoses also included amiodarone toxicity, connective tissue disorder although there is no history of autoimmune disease or connective tissue diseases.  Her condition was discussed with family by critical care team and family decided that patient would not want to have her life prolonged for necessary treatments and patient was made comfort care. On Feb 05, 2020 at 2030 patient peacefully expired.     Signed:  Yevonne Aline Hanish Laraia  [facility] 02/05/20, 9:17 PM

## 2020-01-22 NOTE — Progress Notes (Signed)
RN was called into pt's room because NT found pt on the floor. Pt's been situated in bed when RN arrived to the room. Follow protocol, let MD and pt's family know.  NT stated he did turn the bed alarm on before he left pt, however no staff heard bed alarm go off.

## 2020-01-22 NOTE — Progress Notes (Signed)
Pharmacy Antibiotic Note  Kristina Bruce is a 86 y.o. female admitted on 01/16/2020 with shortness of breath.  Pharmacy has been consulted for Vancomycin dosing. WBC is increasing (21.7>>29.3). Renal function ok. Temp has dropped to 96.   Plan: Add Vancomycin 500 mg IV q24h Already on Ceftriaxone/Azithromycin  Drug levels as needed  Height: 5\' 5"  (165.1 cm) Weight: 45.4 kg (100 lb) IBW/kg (Calculated) : 57  Temp (24hrs), Avg:96.8 F (36 C), Min:96 F (35.6 C), Max:97.7 F (36.5 C)  Recent Labs  Lab 01/21/2020 1457 12/25/2019 1714 01/19/20 0555  WBC 21.7*  --  29.3*  CREATININE 0.77  --  0.53  LATICACIDVEN 3.1* 2.1*  --     Estimated Creatinine Clearance: 35.5 mL/min (by C-G formula based on SCr of 0.53 mg/dL).    No Known Allergies  Narda Bonds, PharmD, BCPS Clinical Pharmacist Phone: [phone]

## 2020-01-22 NOTE — Progress Notes (Signed)
NAME:  Kristina Bruce, MRN:  [id number], DOB:  06-08-1930, LOS: 2 ADMISSION DATE:  12/23/2019, CONSULTATION DATE:  9/28 REFERRING MD:  Louanne Belton, CHIEF COMPLAINT:  Dyspnea   Brief History   86 y/o female with-year-old fibrillation presented to the [facility] emergency room with acute respiratory failure with hypoxemia on January 18, 2020.  PCCM consulted due to diffuse parenchymal lung disease of undetermined etiology.  History of present illness   This is a pleasant 86 year old female who has a past medical history significant for atrial fibrillation who takes amiodarone for the same who presented to our facility complaining of 3 to 4 months of worsening shortness of breath.  Upon my examination the nurse tells me that the patient had just been confused and had removed her IVs and her oxygen and her O2 saturation dropped to 60%.  Upon my arrival when I asked the patient why she is here she says it is because she has been having nosebleeds.  However after some time of conversation she seems to be clearing up and she tells me that she said shortness of breath for 3 to 4 months with a dry cough.  She has some nausea and weight loss but has not had a productive cough and she denies vomiting.  She denies any leg swelling.  She says her weight has actually gone down in several weeks and she has not gained weight.  She denies any abnormal rash or arthritis symptoms which are new.  No recent fevers.  She denies ever being told that she has a connective tissue disease at baseline. On chart review it shows that she started on amiodarone in April 2021 400 mg daily.  She took this dose for approximately 1 month and then in May 2021 the dose was reduced to 200 daily for 2 weeks then 100 mg daily after that.    Past Medical History  Paroxysmal atrial fibrillation Osteoporosis Hyperlipidemia Hypertension Coronary artery disease Allergic rhinitis  Significant Hospital Events   9/27 admitted 9/28 PCCM  consulted and started on steroids, worsening dyspnea, hypoxia.  Patient has an unwitnessed fall Vancomycin started for hypothermia, concern for worsening sepsis.  Consults:  Pulmonary  Procedures:  None  Significant Diagnostic Tests:  CT chest 9/27- multifocal diffuse airspace disease with consolidation, basilar predominant.  Cardiomegaly with small pericardial effusion.  I have reviewed the images personally.  Micro Data:  September 27 SARS-CoV-2 and influenza negative September 27 urine culture September 27 blood culture  Antimicrobials:  Ceftriaxone 9/27 >> Azithromycin 9/27 >> Vancomycin 9/29 >>  Interim history/subjective:   Continues on heated high flow at 30 L and nonrebreather Much more delirious and confused Had a fall yesterday.  Head CT unable to be obtained as patient would not lie still   Objective   Blood pressure (!) 119/97, pulse 72, temperature 97.6 F (36.4 C), temperature source Axillary, resp. rate 20, height 5\' 5"  (1.651 m), weight 45.4 kg, SpO2 97 %.    FiO2 (%):  [100 %] 100 %   Intake/Output Summary (Last 24 hours) at 2020/02/13 1125 Last data filed at 02-13-20 1000 Gross per 24 hour  Intake 395.5 ml  Output 500 ml  Net -104.5 ml   Filed Weights   01/19/20 0557  Weight: 45.4 kg    Examination: Gen:      No acute distress, frail, eldery HEENT:  EOMI, sclera anicteric Neck:     No masses; no thyromegaly Lungs:    Clear to auscultation bilaterally; normal  respiratory effort CV:         Regular rate and rhythm; no murmurs Abd:      + bowel sounds; soft, non-tender; no palpable masses, no distension Ext:    No edema; adequate peripheral perfusion Skin:      Warm and dry; no rash Neuro: Awake, confused, plucking at her nasal cannula and mask  Labs/imaging reviewed Significant for sodium 133, BUN/creatinine 16/0.65, lactic acid 1.8, PCT 1.29 WBC 33.5 No new imaging  Resolved Hospital Problem list     Assessment & Plan:  87 year old  with atrial fibrillation on amiodarone previously admitted with hypoxic respiratory failure with bilateral lung infiltrate/consolidation Differential diagnosis includes amiodarone toxicity, pneumonia, [place] No clear evidence of connective tissue disease or autoimmune process  Awaiting CTD serologies Continue steroids.  Suspect steroids are contributing to some delirium Continue broad antibiotic coverage  Goals of care: Discussed with daughter at bedside.  She feels that her mom has a declining course even before this admission and would not want to prolong her suffering unnecessarily It would be reasonable to continue current treatment for now but if any worsening then they would be okay with comfort measures She plans on discussing this further with her brother Consider palliative care consult  Best practice:   Per [place]  Critical care time: n/a   Marshell Garfinkel MD [facility] Please see [email or web address] for pager details.  02/19/20, 12:09 PM

## 2020-01-22 NOTE — Progress Notes (Signed)
Physical Therapy Treatment Patient Details Name: Kristina Bruce MRN: [id number] DOB: 06-17-1930 Today's Date: January 25, 2020    History of Present Illness Pt is an 86 y/o female admitted secondary to hypoxic respiratory failure and confusion.  Diffuse parenchymal lung disease seen on chest imaging. PMH includes HTN, a fib, CHF, and kyphoplasty.     PT Comments    Pt awake on entry talking to daughter about buying something. Pt daughter reports she thinks she is trying to order a tombstone for her father from the [place]. Daughter reports that she never did that. Daughter also reports that pt has needed to go to the bathroom and keeps trying to get up to go, however last night fell. Staff has tried to get her to use the bed pan but has not been successful. Pt reports needing to go to bathroom. Pt impulsive and does not wait for therapist to set up. But able to get [facility] to [facility] for transfer. Pt able to sit [facility] with supervision but when asked to get up pt refuses and gets herself back in bed and does not respond to further questions. Daughter reports that she and her brother have been taking care of her 24 hr/day but that her mobility and cognition have both declined over the last 2 weeks. Given pt advanced dementia PT recommending HHPT with d/c home with family support 24/7.     Follow Up Recommendations  Home health PT;Supervision/Assistance - 24 hour     Equipment Recommendations  Other (comment) (TBD)       Precautions / Restrictions Precautions Precautions: Fall;Other (comment) Precaution Comments: watch O2 sats. 30L HFNC and Nonrebreather Restrictions Weight Bearing Restrictions: No    Mobility  Bed Mobility Overal bed mobility: Needs Assistance Bed Mobility: Supine to Sit;Sit to Supine     Supine to sit: Supervision Sit to supine: Supervision   General bed mobility comments: supervision for safety, O2 dropped to 84%O2, rebounded to 90%O2, pt decided she did not want to get up and got  back into bed and shut her eyes and did not respond to further questions        Balance Overall balance assessment: Needs assistance Sitting-balance support: No upper extremity supported;Feet supported Sitting balance-Leahy Scale: Fair                                      Cognition Arousal/Alertness: Awake/alert Behavior During Therapy: WFL for tasks assessed/performed Overall Cognitive Status: Impaired/Different from baseline Area of Impairment: Orientation;Awareness;Problem solving                 Orientation Level: Disoriented to;Place         Awareness: Emergent Problem Solving: Slow processing General Comments: Pt daughter is in the room and reports that her mother has been talking about things she has never talked about like getting a tombstone for her husband from the [place]. Reports that " I think that          General Comments General comments (skin integrity, edema, etc.): Pt daughter present for session reports pt would like to use bathroom but has not been able to urinate on bedpan. Pt on 30L HHFNC with 15L NRB. Pt able to maintain SaO2 89-96%O2, with movement dropped to 84%O2 but quickly rebounded  to low 90s HR 92-118bpm      Pertinent Vitals/Pain Pain Assessment: Faces Faces Pain Scale: No hurt  PT Goals (current goals can now be found in the care plan section) Acute Rehab PT Goals Patient Stated Goal: none stated PT Goal Formulation: With patient Time For Goal Achievement: 02/02/20 Potential to Achieve Goals: Fair Progress towards PT goals: Not progressing toward goals - comment (limited by decreased cognition and increased O2 demand)    Frequency    Min 3X/week      PT Plan Discharge plan needs to be updated       AM-PAC PT "6 Clicks" Mobility   Outcome Measure  Help needed turning from your back to your side while in a flat bed without using bedrails?: None Help needed moving from lying on your back to  sitting on the side of a flat bed without using bedrails?: None Help needed moving to and from a bed to a chair (including a wheelchair)?: A Lot Help needed standing up from a chair using your arms (e.g., wheelchair or bedside chair)?: A Lot Help needed to walk in hospital room?: A Lot Help needed climbing 3-5 steps with a railing? : Total 6 Click Score: 15    End of Session Equipment Utilized During Treatment: Oxygen Activity Tolerance: Other (comment) (limited by pt cognition ) Patient left: in bed;with call bell/phone within reach;with family/visitor present;with bed alarm set Nurse Communication: Mobility status;Other (comment) (oxygen sats ) PT Visit Diagnosis: Other abnormalities of gait and mobility (R26.89)     Time: 0786-7544 PT Time Calculation (min) (ACUTE ONLY): 19 min  Charges:  $Therapeutic Activity: 8-22 mins                     Raylyn Carton B. Migdalia Dk PT, DPT Acute Rehabilitation Services Pager [phone] Office [phone]    [place] 2020/01/29, 4:46 PM

## 2020-01-22 NOTE — Progress Notes (Signed)
Per RN patient is now comfort care. RN giving morphine and will remove oxygen once patient is more comfortable. RT told RN to call if they needed any assistance.

## 2020-01-22 NOTE — Evaluation (Signed)
Clinical/Bedside Swallow Evaluation Patient Details  Name: Kristina Bruce MRN: [id number] Date of Birth: [birth year]  Today's Date: Feb 17, 2020 Time: SLP Start Time (ACUTE ONLY): 8299 SLP Stop Time (ACUTE ONLY): 1000 SLP Time Calculation (min) (ACUTE ONLY): 22 min  Past Medical History:  Past Medical History:  Diagnosis Date  . Allergic rhinitis   . Choroidal nevus of right eye    Per [place] new patient packet  . Coronary atherosclerosis of native coronary artery   . Dehydration    Per [facility] new patient packet  . Essential hypertension 03/03/2019  . Essential hypertension, benign   . Essential hypertension, benign   . H/O mammogram 2020   Per [facility] new pateint packet[facility]  . Intermediate coronary syndrome ([place])   . Loss of appetite    Per [facility] new patient packet  . Low blood pressure    Per [facility] new patient packet  . Mixed hyperlipidemia 03/03/2019  . Nausea    Per [place] new patient packet  . Osteopenia    with Vitamin D Deficiency   . Osteoporosis    Per [facility] new patient packet  . PAC (premature atrial contraction) 03/03/2019  . PAF (paroxysmal atrial fibrillation) ([place]) 07/31/2019  . Syncope and collapse 03/03/2019   Past Surgical History:  Past Surgical History:  Procedure Laterality Date  . ABDOMINAL HYSTERECTOMY  1970s   total   HPI:  Pt is an 86 y.o. female with medical history significant of paroxysmal A. fib, hypertension, chronic diastolic CHF, anxiety depression, who presented with worsening of shortness of breath. CXR 9/28: Extensive bilateral interstitial and airspace opacification; multifocal pneumonia and/or pulmonary edema. Rapid response on 9/28 due to pt saturations dropped to 88% on NRB/HHFNC.    Assessment / Plan / Recommendation Clinical Impression  Pt was seen for bedside swallow evaluation with her daughter present. Pt's daughter reported that the pt was tolerating regular texture solids and thin liquids without s/sx of aspiration when she last saw her  eat on 9/27 but p.o. intake had been poor "for some time" due to pt's c/o nausea with eating. Oral mechanism exam was limited due to pt's difficulty following commands; however, oral motor strength and ROM appeared grossly [facility] and she presented with adequate, natural dentition. No s/sx of aspiration were noted with any solids or liquids. Pt required frequent redirection and education regarding the need to keep NRB and nasal cannula in place. Pt's oxygen level was noted to drop to 89% with removal of NRB when she was physically resistant to it being replaced; however, she was able to maintain O2 at/above 94% with intermittent removal of NRB mask for p.o. trials. Considering her respiratory status, it is recommended that her diet be downgraded to dysphagia 1 (puree) with thin liquids. However, p.o. intake should be deferred if pt is dyspneic and/or unable to maintain adequate O2 sats. SLP will follow pt for dysphagia treatment.  SLP Visit Diagnosis: Dysphagia, unspecified (R13.10)    Aspiration Risk  Mild aspiration risk    Diet Recommendation Thin liquid;Dysphagia 1 (Puree)   Liquid Administration via: Cup;Straw Medication Administration: Crushed with puree Supervision: Full supervision/cueing for compensatory strategies Compensations: Slow rate;Small sips/bites (hold p.o. intake if pt dyspneic or unable to maintain O2 ) Postural Changes: Seated upright at 90 degrees;Remain upright for at least 30 minutes after po intake    Other  Recommendations Oral Care Recommendations: Oral care BID;Staff/trained caregiver to provide oral care   Follow up Recommendations Other (comment) (TBD)  Frequency and Duration min 2x/week  2 weeks       Prognosis Prognosis for Safe Diet Advancement: Good Barriers to Reach Goals: Cognitive deficits      Swallow Study   General Date of Onset: 01/19/20 HPI: Pt is an 86 y.o. female with medical history significant of paroxysmal A. fib, hypertension, chronic  diastolic CHF, anxiety depression, who presented with worsening of shortness of breath. CXR 9/28: Extensive bilateral interstitial and airspace opacification; multifocal pneumonia and/or pulmonary edema. Rapid response on 9/28 due to pt saturations dropped to 88% on NRB/HHFNC.  Type of Study: Bedside Swallow Evaluation Previous Swallow Assessment: None Diet Prior to this Study: Regular;Thin liquids (trays have been held) Temperature Spikes Noted: No Respiratory Status: Non-rebreather;Nasal cannula History of Recent Intubation: No Behavior/Cognition: Alert;Pleasant mood;Cooperative Oral Cavity Assessment: Within Functional Limits Oral Care Completed by SLP: No Vision: Functional for self-feeding Patient Positioning: Upright in bed;Postural control adequate for testing    Oral/Motor/Sensory Function Overall Oral Motor/Sensory Function:  (Difficulty following commands)   Ice Chips Ice chips: Within functional limits Presentation: Spoon   Thin Liquid Thin Liquid: Within functional limits Presentation: Straw    Nectar Thick Nectar Thick Liquid: Not tested   Honey Thick Honey Thick Liquid: Not tested   Puree Puree: Within functional limits Presentation: Spoon   Solid     Solid: Within functional limits Presentation: Kristina Bruce, [place], [place] Office number [phone] Pager [phone]  Horton Marshall 02/04/2020,10:15 AM

## 2020-01-22 NOTE — Progress Notes (Signed)
Physical Therapy Treatment Patient Details Name: Kristina Bruce MRN: [id number] DOB: [birth year] Today's Date: February 15, 2020    History of Present Illness Pt is an 86 y/o female admitted secondary to hypoxic respiratory failure and confusion.  Diffuse parenchymal lung disease seen on chest imaging. PMH includes HTN, a fib, CHF, and kyphoplasty.     PT Comments    Pt bed alarm sounding and pt trying to get out of bed between footboard and end of lower bed rail. Daughter and NT in room requesting assist with transfer to [facility]. With maxA pt able to stand pivot to [facility] where she was able to void an appreciable amount of urine. Pt reports feeling much better. Pt sit>stand for pericare from NT with modA, and maxA to pivot back to bed. Pt requires modA for bringing LE back into bed and pulled up in bed. Pt SaO2 on 30L O2 via [place] with 15 L NRB prior to transfer 90%O2 with transfer to [facility] dropped to 82%O2 with seated rebounded to 88%O2, dropped again to 84%O2 with transfer back to bed and requires 3 min to rebound to 90%O2 in supine. Pt will require close supervision due to her impulsivity especially with respect to toileting. RN notified of success with voiding with use of BSC.       Follow Up Recommendations  Home health PT;Supervision/Assistance - 24 hour     Equipment Recommendations  Other (comment) (TBD)       Precautions / Restrictions Precautions Precautions: Fall;Other (comment) Precaution Comments: watch O2 sats. 30L HFNC and Nonrebreather Restrictions Weight Bearing Restrictions: No    Mobility  Bed Mobility Overal bed mobility: Needs Assistance Bed Mobility: Sit to Supine     Supine to sit: Supervision Sit to supine: Mod assist   General bed mobility comments: modA for returning LE to bed and for pad scoot up in bed.   Transfers Overall transfer level: Needs assistance Equipment used: None Transfers: Stand Pivot Transfers   Stand pivot transfers: Max assist       General  transfer comment: maxA for stand pivot to and from [facility], vc for sequencing, and use of armrests      Balance Overall balance assessment: Needs assistance Sitting-balance support: No upper extremity supported;Feet supported Sitting balance-Leahy Scale: Fair     Standing balance support: Bilateral upper extremity supported;During functional activity Standing balance-Leahy Scale: Poor                              Cognition Arousal/Alertness: Awake/alert Behavior During Therapy: WFL for tasks assessed/performed Overall Cognitive Status: Impaired/Different from baseline Area of Impairment: Orientation;Awareness;Problem solving                 Orientation Level: Disoriented to;Place         Awareness: Emergent Problem Solving: Slow processing General Comments: Pt trying to get OOB to [facility], on entry with bed alarm ringing      Exercises      General Comments General comments (skin integrity, edema, etc.): With transfer to [facility] SaO2 dropped to 84%O2, with transfer back to bed and HoB raised SaO2 rebounded to 90%O2 after about 3 min       Pertinent Vitals/Pain Pain Assessment: No/denies pain Faces Pain Scale: No hurt           PT Goals (current goals can now be found in the care plan section) Acute Rehab PT Goals Patient Stated Goal: none stated PT Goal Formulation:  With patient Time For Goal Achievement: 02/02/20 Potential to Achieve Goals: Fair Progress towards PT goals: Progressing toward goals    Frequency    Min 3X/week      PT Plan Discharge plan needs to be updated       AM-PAC PT "6 Clicks" Mobility   Outcome Measure  Help needed turning from your back to your side while in a flat bed without using bedrails?: None Help needed moving from lying on your back to sitting on the side of a flat bed without using bedrails?: None Help needed moving to and from a bed to a chair (including a wheelchair)?: A Lot Help needed standing up from a  chair using your arms (e.g., wheelchair or bedside chair)?: A Lot Help needed to walk in hospital room?: A Lot Help needed climbing 3-5 steps with a railing? : Total 6 Click Score: 15    End of Session Equipment Utilized During Treatment: Oxygen Activity Tolerance: Other (comment) (limited by pt cognition ) Patient left: in bed;with call bell/phone within reach;with family/visitor present;with bed alarm set Nurse Communication: Mobility status;Other (comment) (oxygen sats ) PT Visit Diagnosis: Other abnormalities of gait and mobility (R26.89)     Time: 0177-9390 PT Time Calculation (min) (ACUTE ONLY): 17 min  Charges:  $Therapeutic Activity: 8-22 mins                     [place] B. Migdalia Dk PT, DPT Acute Rehabilitation Services Pager [phone] Office [phone]    [place] 2020/01/29, 4:57 PM

## 2020-01-22 NOTE — Progress Notes (Signed)
PCCM progress note  Called back by daughter to bedside She discussed with the brother and both of them agree that they would not want to prolong uncecessary treatment  If no improvement by tomorrow or worsening then family would be ok with comfort measures  Marshell Garfinkel MD [facility]  2020/01/27, 1:08 PM

## 2020-01-22 NOTE — Progress Notes (Signed)
PCCM progress note  Patient has become increasingly confused, delirious and removing IVs, oxygen mask Her daughter is concerned and feels that mom is suffering Likely steroids are exacerbating her delirium  Discussed with daughter who wants mom to be comfortable. Agreed to transition to comfort measures. Orders placed for morphine drip.  The patient is critically ill with multiple organ system failure and requires high complexity decision making for assessment and support, frequent evaluation and titration of therapies, advanced monitoring, review of radiographic studies and interpretation of complex data.   Critical Care Time devoted to patient care services, exclusive of separately billable procedures, described in this note is 35 minutes.   Marshell Garfinkel MD [facility] Please see [email or web address] for pager details.  04-Feb-2020, 6:49 PM

## 2020-01-22 NOTE — Progress Notes (Signed)
Responded to Patient room after call from patient daughter at the bedside reporting patient trying to get out of bed.  When I arrived to room patient had removed sat probe and NRB oxygen mask.  She was confused, trying to get out of bed, and did want to allow anyone to help her.  After placing patient on monitor O2 sats are 76%.  Able to convince patient to wear oxygen devices and rest back in bed.    Daughter expresses concerns and shares conversation she had with Dr. Vaughan Browner today about giving mother 24 hours.  Offered support and daughter went on to share felt mom suffering.  I agreed with her that was my assessment and offered to reach out to Dr. Vaughan Browner for medication to help with moms comfort at this time while giving her that 24 hours to show improvement.  Patients daughter was supportive of this plan.    Made contact with Dr. Vaughan Browner and provided update.   Richardean Canal RN, BSN, CCRN

## 2020-01-22 DEATH — deceased

## 2020-01-23 LAB — CULTURE, BLOOD (ROUTINE X 2)
Culture: NO GROWTH
Culture: NO GROWTH
Special Requests: ADEQUATE

## 2020-01-27 ENCOUNTER — Ambulatory Visit: Payer: Medicare Other | Admitting: Cardiology

## 2020-06-03 IMAGING — DX DG CHEST 1V PORT
1 series · 1 of 1 positions shown · non-contrast
Comparison: 02/23/2019

CLINICAL DATA: Atrial fibrillation, hypertension, recent right
ocular surgery for melanoma

EXAM:
PORTABLE CHEST 1 VIEW

[chest ap]
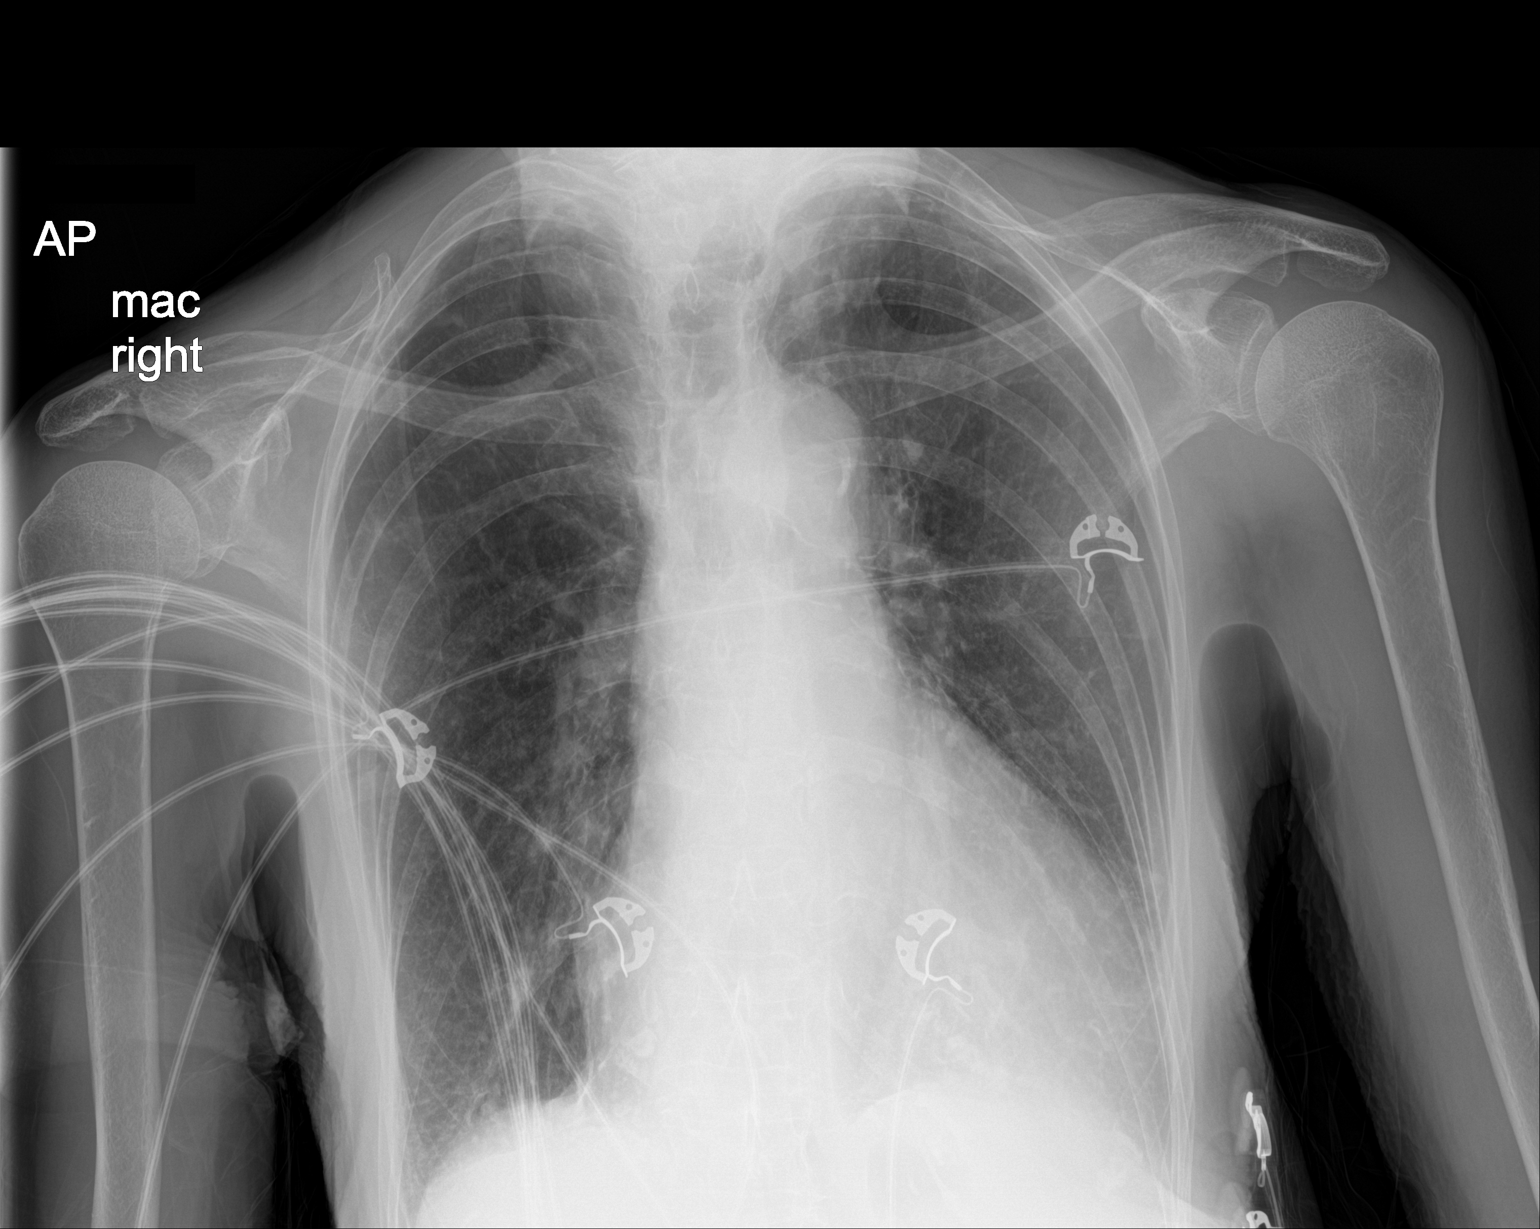

[1 of 1 positions shown; findings below may reference images not displayed]

FINDINGS: Single frontal view of the chest demonstrates an enlarged cardiac
silhouette. Emphysema again noted without airspace disease,
effusion, or pneumothorax. No acute bony abnormality.
IMPRESSION: 1. Enlarged cardiac silhouette.
2. Stable emphysema.  No acute airspace disease.

## 2020-11-16 IMAGING — US US ABDOMEN LIMITED
1 series · 13 of 25 positions shown · non-contrast
Comparison: CT chest 01/18/2020, CT abdomen pelvis 06/07/2017

CLINICAL DATA: Elevation of LFTs

EXAM:
ULTRASOUND ABDOMEN LIMITED RIGHT UPPER QUADRANT

[Series 1: us abdomen limited ruq · 13 of 31 slices shown]
[im 1/31]
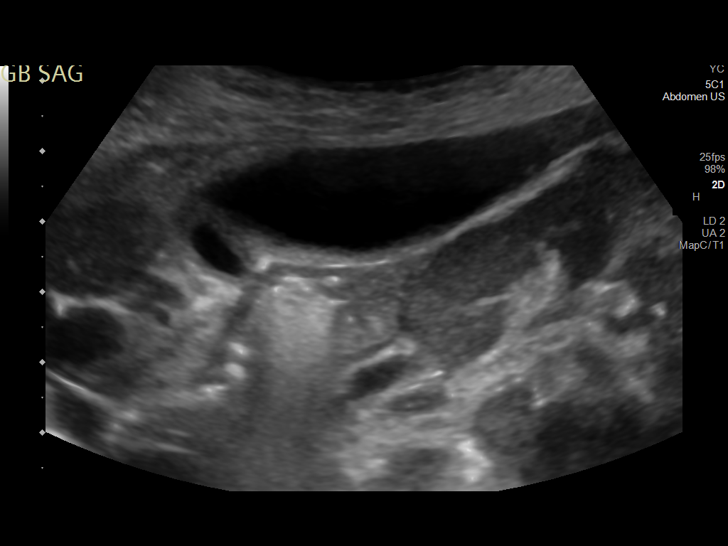
[im 3/31]
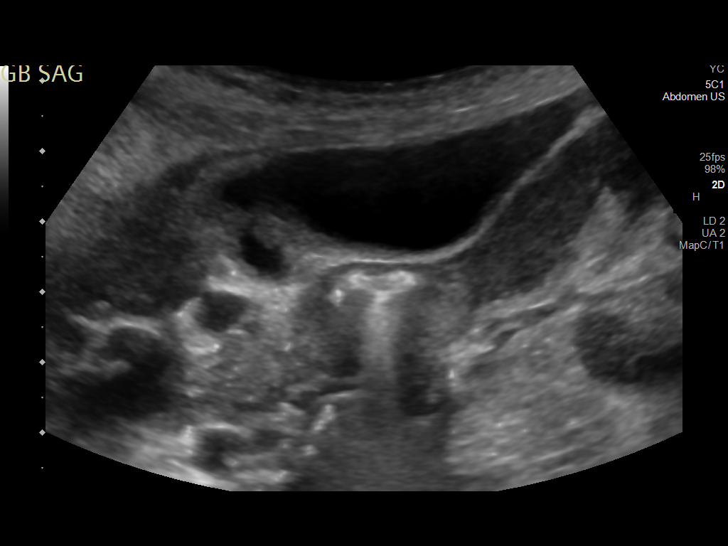
[im 6/31]
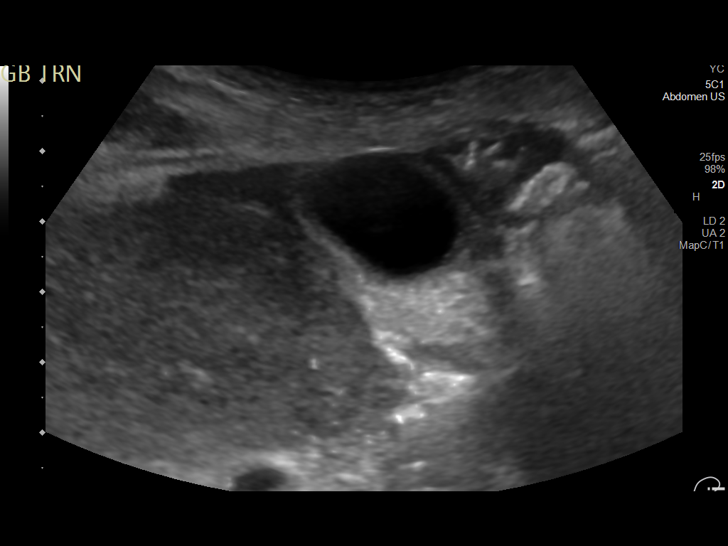
[im 8/31]
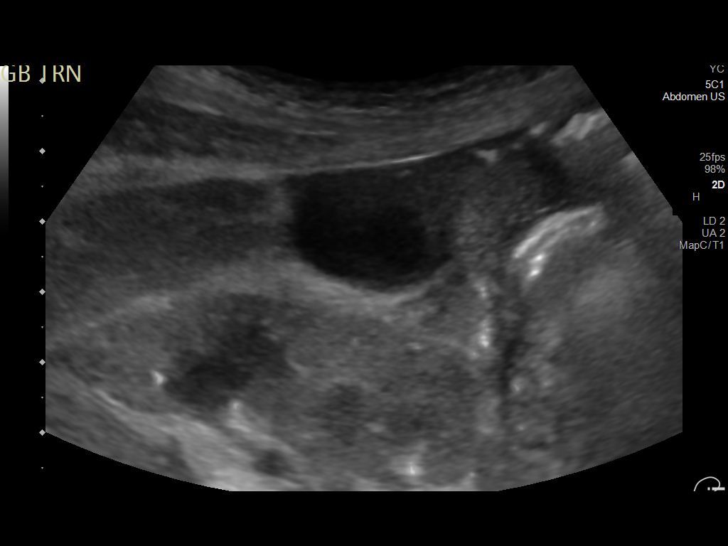
[im 11/31]
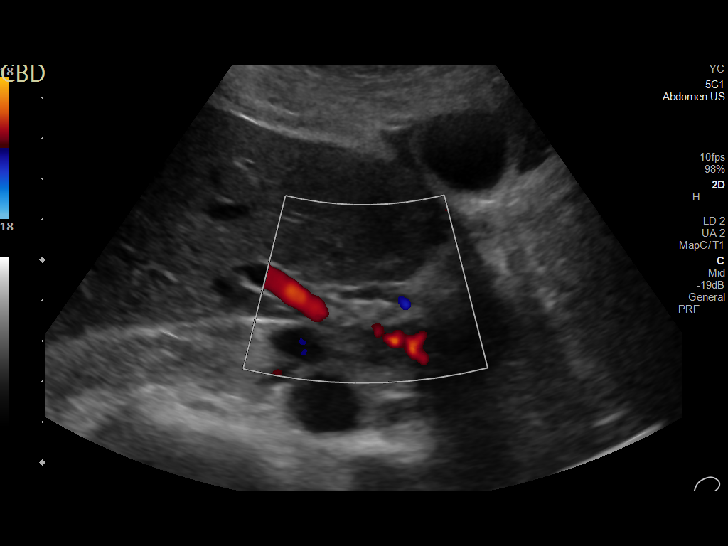
[im 13/31]
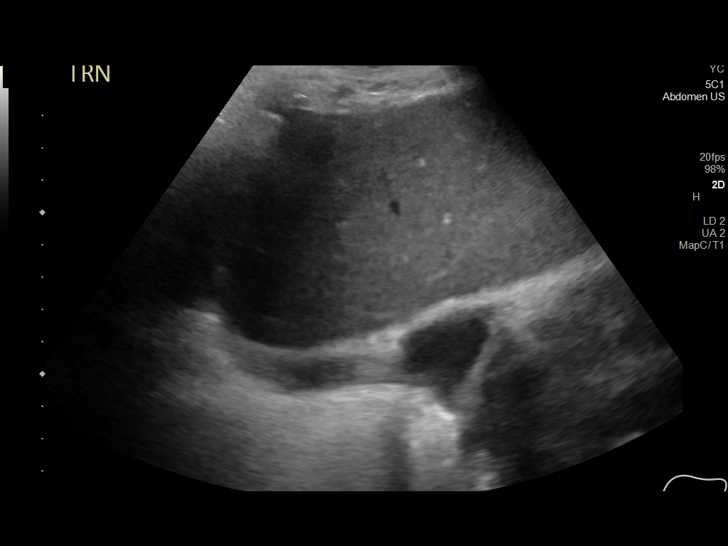
[im 16/31]
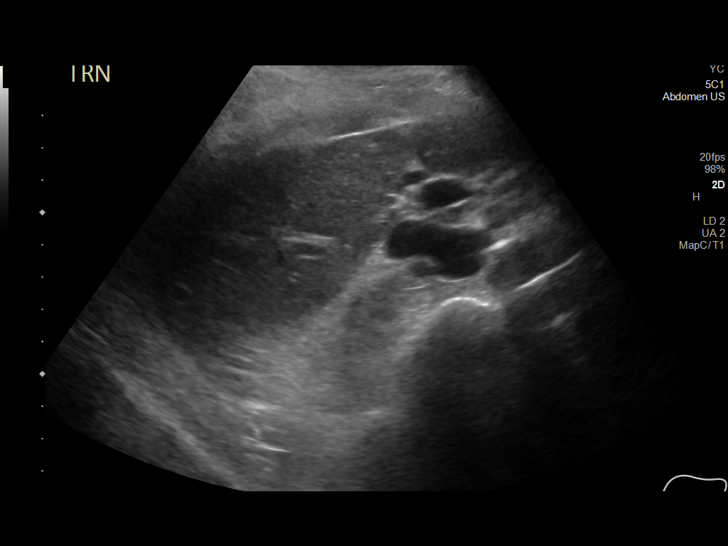
[im 18/31]
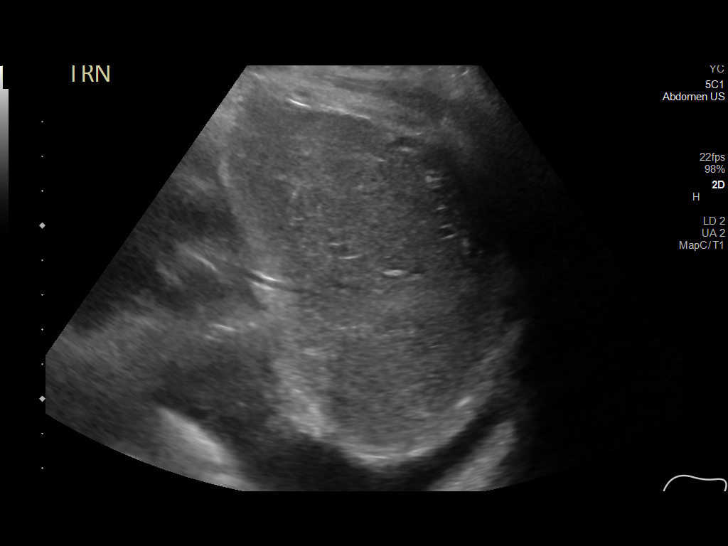
[im 21/31]
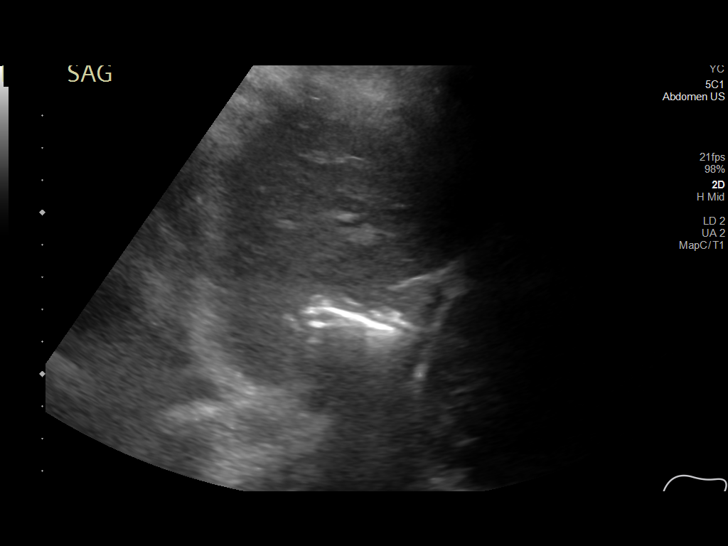
[im 23/31]
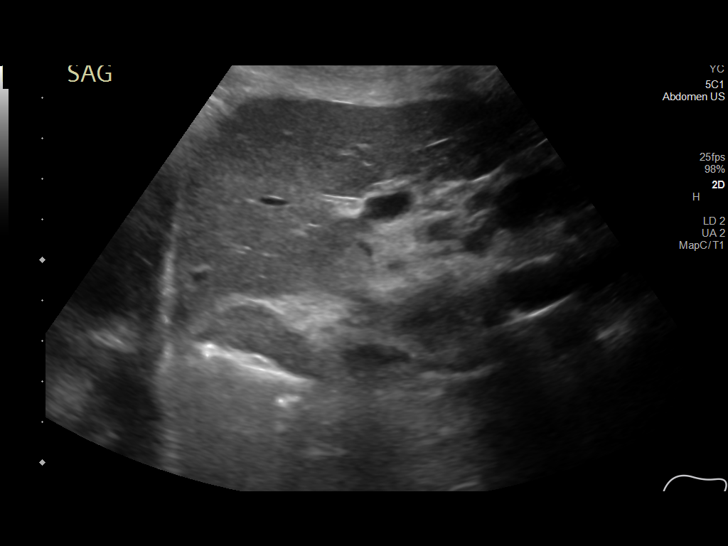
[im 26/31]
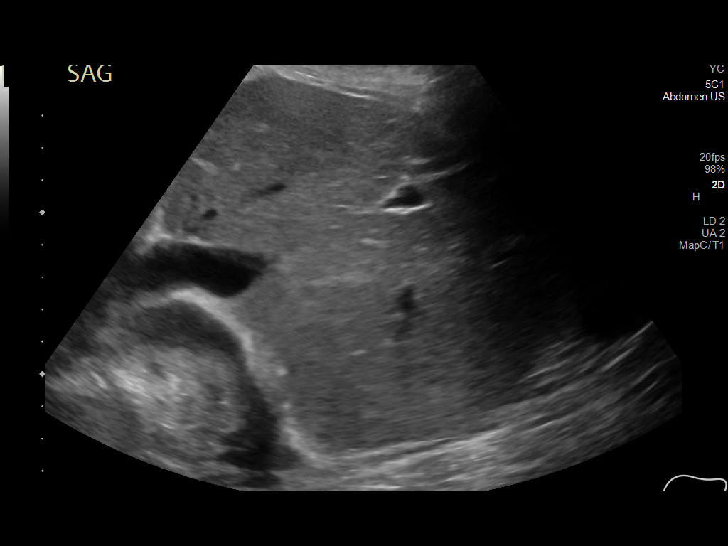
[im 28/31]
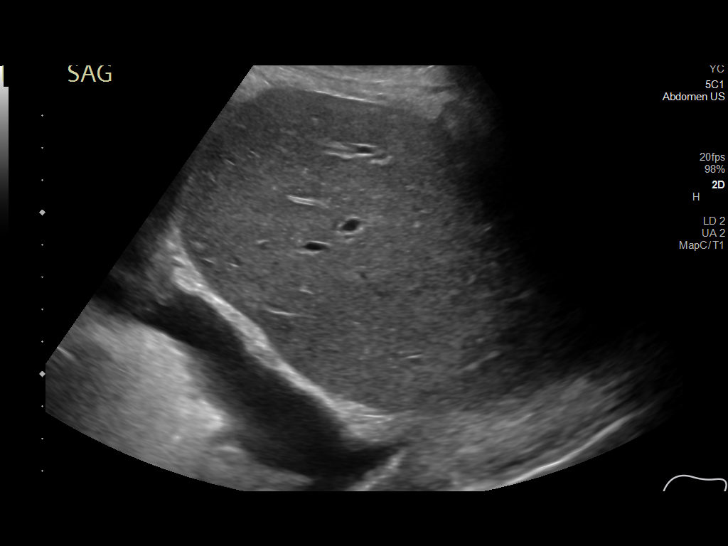
[im 31/31]
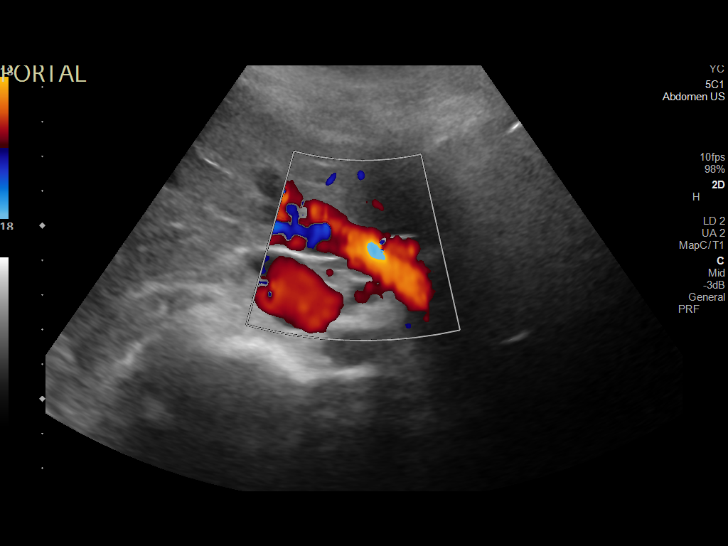

[13 of 25 positions shown; findings below may reference images not displayed]

FINDINGS: Gallbladder:

No visible gallstones or significant gallbladder wall thickening
however patient was unable to reposition for decubitus imaging. A
small amount of pericholecystic fluid is present. Sonographic Murphy
sign is negative.

Common bile duct:

Diameter: 3.7 mm, nondilated.

Liver:

Questionable micro nodularity of the liver surface. No focal lesion
identified. Within normal limits in parenchymal echogenicity. Portal
vein is patent on color Doppler imaging with normal direction of
blood flow towards the liver.

Other: Incidental note made of a right pleural effusion.
IMPRESSION: Question some mild micro nodularity of the hepatic surface contour.
Could be seen in the setting of intrinsic liver disease/cirrhosis or
hepatic congestion.

Trace amount of pericholecystic fluid without visible
cholelithiasis, gallbladder wall thickening or sonographic Murphy
sign. Finding is nonspecific, particularly in the setting of
suspected intrinsic liver disease. Correlate with exam findings and
if there is persisting clinical concern for acute cholecystitis,
HIDA could be obtained.

Incidentally noted right pleural effusion.

## 2020-11-17 IMAGING — DX DG CHEST 1V PORT
1 series · 1 of 1 positions shown · non-contrast
Comparison: Chest radiograph from the same day

CLINICAL DATA: Acute hypercapnic respiratory failure.

EXAM:
PORTABLE CHEST 1 VIEW

[chest]
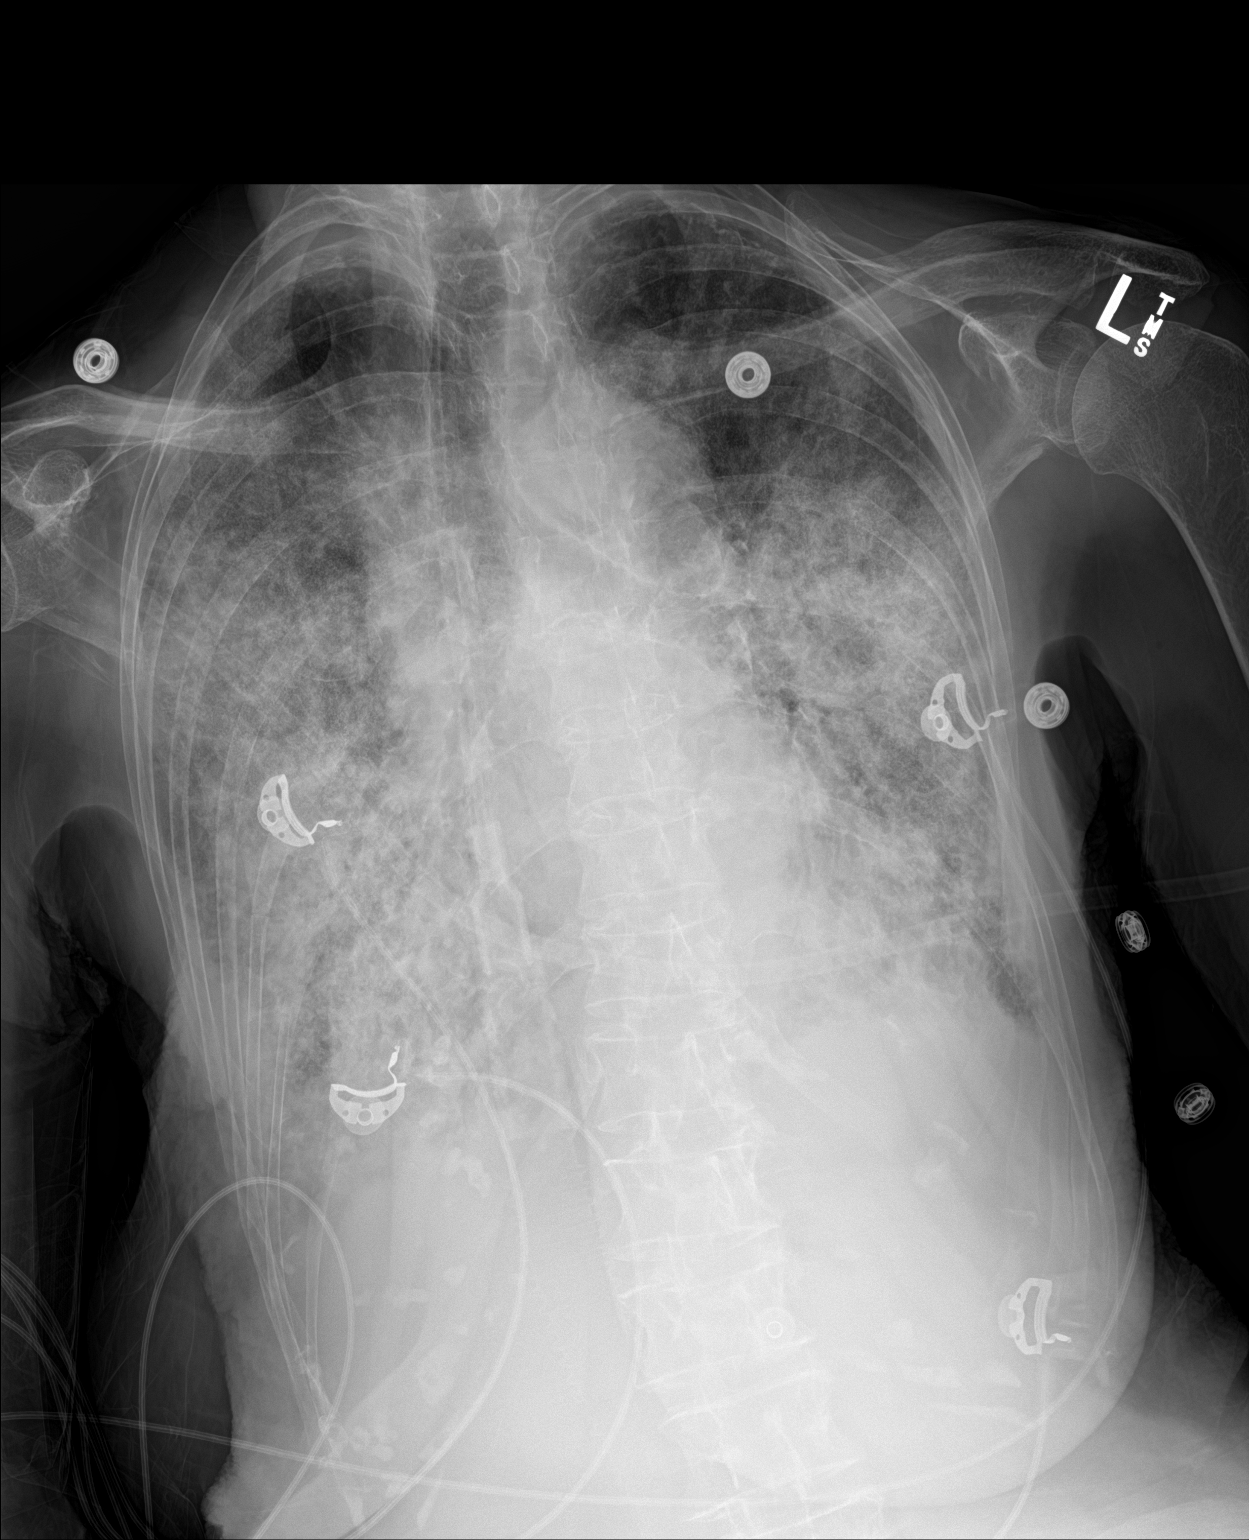

[1 of 1 positions shown; findings below may reference images not displayed]

FINDINGS: There is extensive bilateral interstitial and airspace
opacification, similar to prior from the same day. Small left
pleural effusion is suspected. No discernible pneumothorax. Cardiac
and mediastinal contours are largely obscured no evidence of acute
osseous abnormality.
IMPRESSION: Extensive bilateral interstitial and airspace opacification, similar
to prior from the same day and increased from 01/18/2020. Findings
could reflect multifocal pneumonia and/or pulmonary edema.
